# Patient Record
Sex: Female | Born: 1935 | Race: White | Hispanic: No | Marital: Married | Smoking: Never smoker
Health system: Southern US, Community
[De-identification: ages and names within clinical notes are randomized; demographics above are authoritative.]

## PROBLEM LIST (undated history)

## (undated) DIAGNOSIS — K5900 Constipation, unspecified: Secondary | ICD-10-CM

## (undated) DIAGNOSIS — K5790 Diverticulosis of intestine, part unspecified, without perforation or abscess without bleeding: Secondary | ICD-10-CM

## (undated) DIAGNOSIS — D649 Anemia, unspecified: Secondary | ICD-10-CM

## (undated) DIAGNOSIS — D472 Monoclonal gammopathy: Secondary | ICD-10-CM

## (undated) DIAGNOSIS — E039 Hypothyroidism, unspecified: Secondary | ICD-10-CM

## (undated) DIAGNOSIS — M1990 Unspecified osteoarthritis, unspecified site: Secondary | ICD-10-CM

## (undated) DIAGNOSIS — E785 Hyperlipidemia, unspecified: Secondary | ICD-10-CM

## (undated) DIAGNOSIS — I10 Essential (primary) hypertension: Secondary | ICD-10-CM

## (undated) DIAGNOSIS — D539 Nutritional anemia, unspecified: Secondary | ICD-10-CM

## (undated) DIAGNOSIS — E119 Type 2 diabetes mellitus without complications: Secondary | ICD-10-CM

## (undated) HISTORY — DX: Monoclonal gammopathy: D47.2

## (undated) HISTORY — DX: Nutritional anemia, unspecified: D53.9

## (undated) HISTORY — PX: TONSILLECTOMY: SUR1361

## (undated) HISTORY — PX: CATARACT EXTRACTION, BILATERAL: SHX1313

## (undated) HISTORY — PX: ABDOMINAL HYSTERECTOMY: SHX81

---

## 1998-03-25 ENCOUNTER — Ambulatory Visit (HOSPITAL_COMMUNITY): Admission: RE | Admit: 1998-03-25 | Discharge: 1998-03-25 | Payer: Self-pay | Admitting: Obstetrics & Gynecology

## 1998-05-13 ENCOUNTER — Ambulatory Visit (HOSPITAL_COMMUNITY): Admission: RE | Admit: 1998-05-13 | Discharge: 1998-05-13 | Payer: Self-pay | Admitting: Gastroenterology

## 1998-05-13 HISTORY — PX: ESOPHAGOGASTRODUODENOSCOPY ENDOSCOPY: SHX5814

## 2005-03-10 ENCOUNTER — Encounter: Admission: RE | Admit: 2005-03-10 | Discharge: 2005-03-10 | Payer: Self-pay | Admitting: Internal Medicine

## 2006-03-11 ENCOUNTER — Encounter: Admission: RE | Admit: 2006-03-11 | Discharge: 2006-03-11 | Payer: Self-pay | Admitting: Internal Medicine

## 2013-04-19 ENCOUNTER — Ambulatory Visit
Admission: RE | Admit: 2013-04-19 | Discharge: 2013-04-19 | Disposition: A | Discharge status: Home / Self Care | Payer: Medicare Other | Source: Ambulatory Visit | Attending: Internal Medicine | Admitting: Internal Medicine

## 2013-04-19 ENCOUNTER — Other Ambulatory Visit: Payer: Self-pay | Admitting: Internal Medicine

## 2013-04-19 DIAGNOSIS — R05 Cough: Secondary | ICD-10-CM

## 2013-04-19 DIAGNOSIS — R059 Cough, unspecified: Secondary | ICD-10-CM

## 2013-05-07 ENCOUNTER — Ambulatory Visit
Admission: RE | Admit: 2013-05-07 | Discharge: 2013-05-07 | Disposition: A | Discharge status: Home / Self Care | Payer: Medicare Other | Source: Ambulatory Visit | Attending: Internal Medicine | Admitting: Internal Medicine

## 2013-05-07 ENCOUNTER — Other Ambulatory Visit: Payer: Self-pay | Admitting: Internal Medicine

## 2013-05-07 DIAGNOSIS — W19XXXA Unspecified fall, initial encounter: Secondary | ICD-10-CM

## 2013-08-22 ENCOUNTER — Encounter (HOSPITAL_COMMUNITY): Payer: Self-pay | Admitting: Emergency Medicine

## 2013-08-22 ENCOUNTER — Inpatient Hospital Stay (HOSPITAL_COMMUNITY)
Admission: EM | Admit: 2013-08-22 | Discharge: 2013-08-24 | DRG: 545 | Disposition: A | Discharge status: Home Health | Payer: Medicare Other | Attending: Internal Medicine | Admitting: Internal Medicine

## 2013-08-22 ENCOUNTER — Observation Stay (HOSPITAL_COMMUNITY): Payer: Medicare Other

## 2013-08-22 DIAGNOSIS — R700 Elevated erythrocyte sedimentation rate: Secondary | ICD-10-CM | POA: Diagnosis present

## 2013-08-22 DIAGNOSIS — R5381 Other malaise: Secondary | ICD-10-CM | POA: Diagnosis present

## 2013-08-22 DIAGNOSIS — R29898 Other symptoms and signs involving the musculoskeletal system: Secondary | ICD-10-CM | POA: Diagnosis present

## 2013-08-22 DIAGNOSIS — E785 Hyperlipidemia, unspecified: Secondary | ICD-10-CM | POA: Diagnosis present

## 2013-08-22 DIAGNOSIS — E039 Hypothyroidism, unspecified: Secondary | ICD-10-CM | POA: Diagnosis present

## 2013-08-22 DIAGNOSIS — K5900 Constipation, unspecified: Secondary | ICD-10-CM | POA: Diagnosis present

## 2013-08-22 DIAGNOSIS — Z800 Family history of malignant neoplasm of digestive organs: Secondary | ICD-10-CM

## 2013-08-22 DIAGNOSIS — Z8249 Family history of ischemic heart disease and other diseases of the circulatory system: Secondary | ICD-10-CM

## 2013-08-22 DIAGNOSIS — D638 Anemia in other chronic diseases classified elsewhere: Secondary | ICD-10-CM | POA: Diagnosis present

## 2013-08-22 DIAGNOSIS — E860 Dehydration: Secondary | ICD-10-CM | POA: Diagnosis present

## 2013-08-22 DIAGNOSIS — I129 Hypertensive chronic kidney disease with stage 1 through stage 4 chronic kidney disease, or unspecified chronic kidney disease: Secondary | ICD-10-CM | POA: Diagnosis present

## 2013-08-22 DIAGNOSIS — R296 Repeated falls: Secondary | ICD-10-CM

## 2013-08-22 DIAGNOSIS — D649 Anemia, unspecified: Secondary | ICD-10-CM | POA: Diagnosis present

## 2013-08-22 DIAGNOSIS — M353 Polymyalgia rheumatica: Principal | ICD-10-CM | POA: Diagnosis present

## 2013-08-22 DIAGNOSIS — Z9849 Cataract extraction status, unspecified eye: Secondary | ICD-10-CM

## 2013-08-22 DIAGNOSIS — M129 Arthropathy, unspecified: Secondary | ICD-10-CM | POA: Diagnosis present

## 2013-08-22 DIAGNOSIS — I10 Essential (primary) hypertension: Secondary | ICD-10-CM

## 2013-08-22 DIAGNOSIS — Z9181 History of falling: Secondary | ICD-10-CM

## 2013-08-22 DIAGNOSIS — E119 Type 2 diabetes mellitus without complications: Secondary | ICD-10-CM | POA: Diagnosis present

## 2013-08-22 DIAGNOSIS — I498 Other specified cardiac arrhythmias: Secondary | ICD-10-CM | POA: Diagnosis present

## 2013-08-22 DIAGNOSIS — E871 Hypo-osmolality and hyponatremia: Secondary | ICD-10-CM | POA: Diagnosis present

## 2013-08-22 DIAGNOSIS — Z681 Body mass index (BMI) 19 or less, adult: Secondary | ICD-10-CM

## 2013-08-22 DIAGNOSIS — E440 Moderate protein-calorie malnutrition: Secondary | ICD-10-CM | POA: Diagnosis present

## 2013-08-22 DIAGNOSIS — K5790 Diverticulosis of intestine, part unspecified, without perforation or abscess without bleeding: Secondary | ICD-10-CM | POA: Insufficient documentation

## 2013-08-22 DIAGNOSIS — K5730 Diverticulosis of large intestine without perforation or abscess without bleeding: Secondary | ICD-10-CM | POA: Diagnosis present

## 2013-08-22 DIAGNOSIS — R000 Tachycardia, unspecified: Secondary | ICD-10-CM | POA: Diagnosis present

## 2013-08-22 DIAGNOSIS — D509 Iron deficiency anemia, unspecified: Secondary | ICD-10-CM | POA: Diagnosis present

## 2013-08-22 DIAGNOSIS — N183 Chronic kidney disease, stage 3 unspecified: Secondary | ICD-10-CM | POA: Diagnosis present

## 2013-08-22 DIAGNOSIS — E43 Unspecified severe protein-calorie malnutrition: Secondary | ICD-10-CM | POA: Diagnosis present

## 2013-08-22 HISTORY — DX: Type 2 diabetes mellitus without complications: E11.9

## 2013-08-22 HISTORY — DX: Unspecified osteoarthritis, unspecified site: M19.90

## 2013-08-22 HISTORY — DX: Diverticulosis of intestine, part unspecified, without perforation or abscess without bleeding: K57.90

## 2013-08-22 HISTORY — DX: Hyperlipidemia, unspecified: E78.5

## 2013-08-22 HISTORY — DX: Hypothyroidism, unspecified: E03.9

## 2013-08-22 HISTORY — DX: Essential (primary) hypertension: I10

## 2013-08-22 HISTORY — DX: Anemia, unspecified: D64.9

## 2013-08-22 HISTORY — DX: Constipation, unspecified: K59.00

## 2013-08-22 LAB — CBC WITH DIFFERENTIAL/PLATELET
Basophils Absolute: 0 10*3/uL (ref 0.0–0.1)
Basophils Relative: 0 % (ref 0–1)
Eosinophils Absolute: 0.1 10*3/uL (ref 0.0–0.7)
Eosinophils Relative: 1 % (ref 0–5)
HCT: 34.9 % — ABNORMAL LOW (ref 36.0–46.0)
Hemoglobin: 11.7 g/dL — ABNORMAL LOW (ref 12.0–15.0)
Lymphocytes Relative: 15 % (ref 12–46)
Lymphs Abs: 0.8 10*3/uL (ref 0.7–4.0)
MCH: 30.9 pg (ref 26.0–34.0)
MCHC: 33.5 g/dL (ref 30.0–36.0)
MCV: 92.1 fL (ref 78.0–100.0)
Monocytes Absolute: 0.8 10*3/uL (ref 0.1–1.0)
Monocytes Relative: 15 % — ABNORMAL HIGH (ref 3–12)
Neutro Abs: 3.6 10*3/uL (ref 1.7–7.7)
Neutrophils Relative %: 68 % (ref 43–77)
Platelets: 389 10*3/uL (ref 150–400)
RBC: 3.79 MIL/uL — ABNORMAL LOW (ref 3.87–5.11)
RDW: 13.2 % (ref 11.5–15.5)
WBC: 5.3 10*3/uL (ref 4.0–10.5)

## 2013-08-22 LAB — COMPREHENSIVE METABOLIC PANEL
ALT: 14 U/L (ref 0–35)
AST: 24 U/L (ref 0–37)
Albumin: 2.9 g/dL — ABNORMAL LOW (ref 3.5–5.2)
Alkaline Phosphatase: 60 U/L (ref 39–117)
BUN: 14 mg/dL (ref 6–23)
CO2: 28 mEq/L (ref 19–32)
Calcium: 9.3 mg/dL (ref 8.4–10.5)
Chloride: 94 mEq/L — ABNORMAL LOW (ref 96–112)
Creatinine, Ser: 0.85 mg/dL (ref 0.50–1.10)
GFR calc Af Amer: 67 mL/min — ABNORMAL LOW (ref >90)
GFR calc non Af Amer: 58 mL/min — ABNORMAL LOW (ref >90)
Glucose, Bld: 109 mg/dL — ABNORMAL HIGH (ref 70–99)
Potassium: 4.2 mEq/L (ref 3.5–5.1)
Sodium: 132 mEq/L — ABNORMAL LOW (ref 135–145)
Total Bilirubin: 0.4 mg/dL (ref 0.3–1.2)
Total Protein: 7.3 g/dL (ref 6.0–8.3)

## 2013-08-22 LAB — URINALYSIS, ROUTINE W REFLEX MICROSCOPIC
Bilirubin Urine: NEGATIVE
Glucose, UA: NEGATIVE mg/dL
Hgb urine dipstick: NEGATIVE
Ketones, ur: NEGATIVE mg/dL
Leukocytes, UA: NEGATIVE
Nitrite: NEGATIVE
Protein, ur: NEGATIVE mg/dL
Specific Gravity, Urine: 1.012 (ref 1.005–1.030)
Urobilinogen, UA: 0.2 mg/dL (ref 0.0–1.0)
pH: 6.5 (ref 5.0–8.0)

## 2013-08-22 LAB — OCCULT BLOOD X 1 CARD TO LAB, STOOL: Fecal Occult Bld: NEGATIVE

## 2013-08-22 MED ORDER — POLYETHYLENE GLYCOL 3350 17 G PO PACK
17.0000 g | PACK | Freq: Every day | ORAL | Status: DC | PRN
  Administered 2013-08-22 – 2013-08-23 (×2): 17 g via ORAL
  Filled 2013-08-22 (×2): qty 1

## 2013-08-22 MED ORDER — ONDANSETRON HCL 4 MG PO TABS: 4.0000 mg | ORAL_TABLET | Freq: Four times a day (QID) | ORAL | Status: DC | PRN

## 2013-08-22 MED ORDER — ACETAMINOPHEN 500 MG PO TABS: 500.0000 mg | ORAL_TABLET | Freq: Three times a day (TID) | ORAL | Status: DC | PRN

## 2013-08-22 MED ORDER — B COMPLEX-C PO TABS
1.0000 | ORAL_TABLET | Freq: Every day | ORAL | Status: DC
  Administered 2013-08-22 – 2013-08-24 (×3): 1 via ORAL
  Filled 2013-08-22 (×3): qty 1

## 2013-08-22 MED ORDER — ONDANSETRON HCL 4 MG/2ML IJ SOLN: 4.0000 mg | Freq: Four times a day (QID) | INTRAMUSCULAR | Status: DC | PRN

## 2013-08-22 MED ORDER — ENSURE COMPLETE PO LIQD
237.0000 mL | Freq: Two times a day (BID) | ORAL | Status: DC
  Administered 2013-08-22 – 2013-08-24 (×4): 237 mL via ORAL

## 2013-08-22 MED ORDER — SODIUM CHLORIDE 0.9 % IV SOLN
INTRAVENOUS | Status: DC
  Administered 2013-08-22: 16:00:00 via INTRAVENOUS
  Administered 2013-08-22 – 2013-08-23 (×2): 1000 mL via INTRAVENOUS

## 2013-08-22 MED ORDER — METOPROLOL TARTRATE 25 MG PO TABS
25.0000 mg | ORAL_TABLET | Freq: Every day | ORAL | Status: DC
  Administered 2013-08-22 – 2013-08-24 (×3): 25 mg via ORAL
  Filled 2013-08-22 (×4): qty 1

## 2013-08-22 MED ORDER — SODIUM CHLORIDE 0.9 % IV BOLUS (SEPSIS)
1000.0000 mL | Freq: Once | INTRAVENOUS | Status: AC
  Administered 2013-08-22: 1000 mL via INTRAVENOUS

## 2013-08-22 MED ORDER — POLYVINYL ALCOHOL 1.4 % OP SOLN
1.0000 [drp] | Freq: Every day | OPHTHALMIC | Status: DC
  Administered 2013-08-22 – 2013-08-23 (×2): 1 [drp] via OPHTHALMIC
  Filled 2013-08-22: qty 15

## 2013-08-22 MED ORDER — ENOXAPARIN SODIUM 40 MG/0.4ML ~~LOC~~ SOLN: 40.0000 mg | SUBCUTANEOUS | Status: DC

## 2013-08-22 MED ORDER — SIMVASTATIN 20 MG PO TABS
20.0000 mg | ORAL_TABLET | Freq: Every day | ORAL | Status: DC
  Administered 2013-08-22 – 2013-08-23 (×2): 20 mg via ORAL
  Filled 2013-08-22 (×3): qty 1

## 2013-08-22 MED ORDER — ENOXAPARIN SODIUM 30 MG/0.3ML ~~LOC~~ SOLN
30.0000 mg | SUBCUTANEOUS | Status: DC
  Administered 2013-08-22 – 2013-08-23 (×2): 30 mg via SUBCUTANEOUS
  Filled 2013-08-22 (×3): qty 0.3

## 2013-08-22 MED ORDER — LEVOTHYROXINE SODIUM 125 MCG PO TABS
125.0000 ug | ORAL_TABLET | Freq: Every day | ORAL | Status: DC
  Administered 2013-08-23 – 2013-08-24 (×2): 125 ug via ORAL
  Filled 2013-08-22 (×3): qty 1

## 2013-08-22 MED ORDER — ADULT MULTIVITAMIN W/MINERALS CH
1.0000 | ORAL_TABLET | Freq: Every day | ORAL | Status: DC
  Administered 2013-08-22 – 2013-08-24 (×3): 1 via ORAL
  Filled 2013-08-22 (×4): qty 1

## 2013-08-22 NOTE — Progress Notes (Signed)
Choice offered  HOME HEALTH AGENCIES SERVING [place]   Agencies that are Medicare-Certified and are affiliated with [facility]  Telephone Number Address  [facility]   [facility] has ownership interest in this company; however, you are under no obligation to use this agency. [phone] or  [phone] [address] [place], [place] [address] [email or web address]   Agencies that are Medicare-Certified and are not affiliated with [facility]                                                                                 Home Health Agency Telephone Number Address  [facility] [phone] Fax [phone] [address], [address] [place], [place]  [address] [email or web address]  [facility] [phone] or [phone] Fax [phone] [address] [place], [place] [address] [email or web address]  [facility] [phone] Fax [phone] [address] [place], [place] [address] [email or web address]  [facility] [phone] Fax [phone] [address], [address] [place], [place]  [address] [email or web address]  [facility] [phone] Fax [phone] [address], [address] [place], [place] [address] [email or web address]  [facility] [phone] [address] [place], [place] [address] [email or web address]  [facility] [phone]  [address] [place], [place] [address] [email or web address]  [facility] [phone] or [phone] Fax number [phone] [address] [place], [place]  [address] [email or web address]  [facility] [phone] Fax [phone] [address] [place], [place]  [address]   [facility]  [phone] Fax [phone] [address] [place], [place] [address] [email or web address]   

## 2013-08-22 NOTE — Progress Notes (Signed)
   CARE MANAGEMENT ED NOTE 08/22/2013  Patient:  ZABRINA, BROTHERTON   Account Number:  [id number]  Date Initiated:  08/22/2013  Documentation initiated by:  Edd Arbour  Subjective/Objective Assessment:   79 yr old medicare/ AARP without a pcp listed in EPIC Pt confirmed pcp as Burton Apley No admission but 1 ED visit in last 6 months Pt states her anticipated d/c is home when she is feeling better with support of her family see below note     Subjective/Objective Assessment Detail:   c/o recommendation from pcp to come to ED to be admitted for dehydration, weakness, diarrhea, anemia according to blood work done earlier this week in his office. Pt also has left rib pain that radiates to back that PCP hasn't been able to treat since August.  Pt has been having multiple falls  support system listed as husband, MONZERRATH MCBURNEY and 2 daughters, & grand children HR 110-129 SBP 154 decreased to 116 hgb 11.7 wbc 5.3     Action/Plan:   Cm spoke with pt updated EPIC 1145 Cm assessed pt & daughter at bedside   Action/Plan Detail:   UR completed 08/22/13 1428 sent to medmgt pending return call   Anticipated DC Date:  08/23/2013     Status Recommendation to Physician:   Result of Recommendation:    Other ED Services  Consult Working Plan    DC Planning Services  Other  PCP issues  Outpatient Services - Pt will follow up    Choice offered to / List presented to:            Status of service:  Completed, signed off  ED Comments:   ED Comments Detail:  Mrs Polidore states her anticipated d/c is home when she is feeling better with support of her family and possible need of home health PT for weakness and DME to include Shower stool and shower grip bar  CM reviewed in details medicare guidelines, home health (HH) (length of stay in home, types of [facility] staff available, coverage, primary caregiver, up to 24 hrs before services may be started), Private duty nursing (PDN-coverage, length of stay  in the home types of staff available), CM reviewed availability of HH SW to assist pcp to get pt to snf (if desired disposition) from the community level. CM provided family with a list of [place] home health agencies & PDNs  Discussed pt to be further evaluated by unit therapists (PT/OT) for recommendation of level of care if admitted and recommendations will be shared with attending MD and unit CM

## 2013-08-22 NOTE — ED Notes (Signed)
Pt PCP  Recommends that pt come in and be admitted for dehydration, weakness, anemia according to blood work done earlier this week in his office. Pt also has left rib pain that radiates to back that PCP hasnt been able to treat since August.  Pt has been having multiple falls.

## 2013-08-22 NOTE — Progress Notes (Signed)
   CARE MANAGEMENT ED NOTE 08/22/2013  Patient:  Marcia Cooper, Marcia Cooper   Account Number:  [id number]  Date Initiated:  08/22/2013  Documentation initiated by:  Edd Arbour  Subjective/Objective Assessment:   79 yr old medicare/ AARP without a pcp listed in EPIC Pt confirmed pcp as Burton Apley No admission but 1 ED visit in last 6 months Pt states her anticipated d/c is home when she is feeling better with support of her family see below note     Subjective/Objective Assessment Detail:   c/o recommendation from pcp to come to ED to be admitted for dehydration, weakness, diarrhea, anemia according to blood work done earlier this week in his office. Pt also has left rib pain that radiates to back that PCP hasn't been able to treat since August.  Pt has been having multiple falls  support system listed as husband, MAHIMA HOTTLE and 2 daughters, & grand children HR 110-129 SBP 154 decreased to 116 hgb 11.7 wbc 5.3     Action/Plan:   Cm spoke with pt updated EPIC 1145 Cm assessed pt & daughter at bedside   Action/Plan Detail:   Anticipated DC Date:       Status Recommendation to Physician:   Result of Recommendation:    Other ED Services  Consult Working Plan    DC Planning Services  Other  PCP issues  Outpatient Services - Pt will follow up    Choice offered to / List presented to:            Status of service:  Completed, signed off  ED Comments:   ED Comments Detail:  Mrs Sand states her anticipated d/c is home when she is feeling better with support of her family and possible need of home health PT for weakness and DME to include Shower stool and shower grip bar  CM reviewed in details medicare guidelines, home health (HH) (length of stay in home, types of [facility] staff available, coverage, primary caregiver, up to 24 hrs before services may be started), Private duty nursing (PDN-coverage, length of stay in the home types of staff available), CM reviewed availability of HH SW to  assist pcp to get pt to snf (if desired disposition) from the community level. CM provided family with a list of [place] home health agencies & PDNs  Discussed pt to be further evaluated by unit therapists (PT/OT) for recommendation of level of care if admitted and recommendations will be shared with attending MD and unit CM

## 2013-08-22 NOTE — H&P (Signed)
[facility] History and Physical  [name] HYQ:[id number] DOB: November 30, 1919 DOA: 08/22/2013  Referring physician:  Raeford Razor PCP:  Lorenda Peck, MD   Chief Complaint:  Fatigue, falls  HPI:  The patient is a 79 y.o. year-old female with history of hypertension, hyperlipidemia, and type 2 diabetes diet controlled, diverticulosis with history of iron deficiency anemia, hypothyroidism who presents with progressive lower extremity weakness, fatigue, frequent falls.  The patient was last at their baseline health several months ago.  The patient states that since August when she had an episode of bronchitis, that her strength has not been the same. She is able to get around her house without cane or [place], however, over the last month, she has had progressive lower extremity weakness and fatigue. About 2 months ago, she had an episode of left upper thoracic back pain for which she was prescribed a Medrol Dosepak which she completed. She continued to have pain, so her primary care doctor gave her a course of prednisone to 10 mg twice daily for 2 weeks. She completed this course of steroids in early November. After she stopped the steroid medication, her weakness is gotten progressively worse. She has some mild swelling of the ankles, but no significant edema, shortness of breath at rest, or orthopnea. She denies any unusual bruising or bleeding. She denies numbness or tingling of her legs, or pain radiating down her legs. She states that her arms have normal strength. Last week, she visited her daughter, and she slept for most of the visit when normally she is very chatty and active. She has a very mild basal cough, denies dysuria, has baseline urinary frequency.  Last colonoscopy was discontinued due to severity of diverticulosis.  Review of Systems:  General:  Denies fevers, chills, weight loss or gain HEENT:  Denies changes to hearing and vision, rhinorrhea, sinus congestion, sore  throat CV:  Denies chest pain and palpitations, lower extremity edema.  PULM:  Denies SOB, wheezing.  Mild cough. GI:  Denies nausea, vomiting.  Chronic constipation.   GU:  Denies dysuria. Chronic frequency, urgency ENDO:  Denies polyuria, polydipsia.   HEME:  Denies hematemesis, blood in stools, melena, abnormal bruising or bleeding.  LYMPH:  Denies lymphadenopathy.   MSK:  Chronic intermittent left mid-thoracic pain.   DERM:  Denies skin rash or ulcer.   NEURO:  Denies focal numbness, weakness, slurred speech, confusion, facial droop except as noted on HPI PSYCH:  Denies anxiety and depression.    Past Medical History  Diagnosis Date  . Hypothyroidism   . Hypertension   . Hyperlipidemia   . Diabetes mellitus without complication   . Anemia   . Arthritis   . Diverticulosis   . Constipation    Past Surgical History  Procedure Laterality Date  . Tonsillectomy    . Abdominal hysterectomy    . Cataract extraction, bilateral    . Esophagogastroduodenoscopy endoscopy  05/13/1998   Social History:  reports that she has never smoked. She has never used smokeless tobacco. She reports that she does not drink alcohol or use illicit drugs. Lives with her husband and ambulates without assist device  No Known Allergies  Family History  Problem Relation Age of Onset  . Colon cancer Mother   . Heart disease Father   . Thyroid disease Daughter   . Thyroid disease Daughter      Prior to Admission medications   Medication Sig Start Date End Date Taking? Authorizing Provider  acetaminophen (TYLENOL) 500  MG tablet Take 500 mg by mouth every 8 (eight) hours as needed for mild pain.   Yes Historical Provider, MD  b complex vitamins tablet Take 1 tablet by mouth daily.   Yes Historical Provider, MD  Calcium-Magnesium-Vitamin D (CALCIUM 500 PO) Take 500 mg by mouth daily.   Yes Historical Provider, MD  carboxymethylcellulose (REFRESH TEARS) 0.5 % SOLN Place 1 drop into both eyes at bedtime.    Yes Historical Provider, MD  levothyroxine (SYNTHROID, LEVOTHROID) 125 MCG tablet Take 125 mcg by mouth daily before breakfast.   Yes Historical Provider, MD  metoprolol tartrate (LOPRESSOR) 25 MG tablet Take 25 mg by mouth daily.   Yes Historical Provider, MD  Multiple Vitamins-Calcium (ONE-A-DAY WOMENS PO) Take 1 tablet by mouth daily.   Yes Historical Provider, MD  polyethylene glycol (MIRALAX / GLYCOLAX) packet Take 17 g by mouth daily as needed for mild constipation.   Yes Historical Provider, MD  simvastatin (ZOCOR) 20 MG tablet Take 20 mg by mouth daily.   Yes Historical Provider, MD   Physical Exam: Filed Vitals:   08/22/13 1000 08/22/13 1045 08/22/13 1252 08/22/13 1310  BP: 115/68  124/71 141/63  Pulse: 91 94 97 105  Temp:   97.8 F (36.6 C) 98.2 F (36.8 C)  TempSrc:   Oral Oral  Resp: 21 23 13 16   Height:      Weight:      SpO2: 97% 95% 96% 97%     General:  Cachectic CF, NAD  Eyes:  PERRL, anicteric, non-injected.  ENT:  Nares clear.  OP clear, non-erythematous without plaques or exudates.  MMM.  Neck:  Supple without TM or JVD.    Lymph:  No cervical, supraclavicular, or submandibular LAD.  Cardiovascular:  RRR, normal S1, S2, without m/r/g.  2+ pulses, warm extremities  Respiratory:  CTA bilaterally without increased WOB.  Abdomen:  NABS.  Soft, ND/NT.    Skin:  No rashes or focal lesions.  Musculoskeletal:  Normal bulk and tone.  Trace bilateral LE edema.  Psychiatric:  A & O x 4.  Appropriate affect.  Neurologic:  CN 3-12 intact.  5/5 strength bilateral lower extremities.  Sensation intact.  Labs on Admission:  Basic Metabolic Panel:  Recent Labs Lab 08/22/13 0840  NA 132*  K 4.2  CL 94*  CO2 28  GLUCOSE 109*  BUN 14  CREATININE 0.85  CALCIUM 9.3   Liver Function Tests:  Recent Labs Lab 08/22/13 0840  AST 24  ALT 14  ALKPHOS 60  BILITOT 0.4  PROT 7.3  ALBUMIN 2.9*   No results found for this basename: LIPASE, AMYLASE,  in the  last 168 hours No results found for this basename: AMMONIA,  in the last 168 hours CBC:  Recent Labs Lab 08/22/13 0840  WBC 5.3  NEUTROABS 3.6  HGB 11.7*  HCT 34.9*  MCV 92.1  PLT 389   Cardiac Enzymes: No results found for this basename: CKTOTAL, CKMB, CKMBINDEX, TROPONINI,  in the last 168 hours  BNP (last 3 results) No results found for this basename: PROBNP,  in the last 8760 hours CBG: No results found for this basename: GLUCAP,  in the last 168 hours  Radiological Exams on Admission: No results found.  EKG: Independently reviewed. Sinus tachycardia with LVH  Assessment/Plan Principal Problem:   Lower extremity weakness Active Problems:   Dehydration   Sinus tachycardia   Normocytic anemia   Hyponatremia   Hypothyroidism   Hypertension   Hyperlipidemia  Diabetes mellitus without complication   Constipation   Recurrent falls  ---  Recurrent falls attributed to lower extremity weakness and fatigue, particularly noticeable over the last month.  DDx is broad, but may be related to recent steroid doses, adrenal insufficiency, thyroid abnl, underlying malignancy, progressive anemia, inflammatory disorder such as polymyalgia rheumatica.     -  PT/OT -  TSH -  Cortisol level -  ESR -  CK  Sinus tachycardia, may be related to dehydration and improved some in the ER with hydration -  Continue IVF -  Check D-dimer to rule out PE (particularly in setting of unexplained left back pain).  If D-dimer > 910, will order V/Q scan as CrCl is only 30 -  TSH pending -  Alternatively, may have missed a dose of metoprolol and having rebound tachycardia  Normocytic anemia with elevated globulin gap -  TSH, folate, B12, iron studies -  SPEP, UPEP/IFE  Hyponatremia and hypochloremia are likely secondary to dehydration -  Repeat in AM  T2DM, diet controlled.  No need for CBG unless A1c elevated. -  Check A1c  HTN/HLD, blood pressure mildly elevated -  Continue beta  blocker at  Home dose today and increase if BP remains elevated -  Continue statin  Constipation, stable.  Continue miralax prn  Moderate protein-calorie malnutrition -  Regular diet with ensure supplements  Diet:  regular Access:  PIV IVF:  yes Proph:  lovenox  Code Status: full Family Communication: patient and her daughter Disposition Plan: Admit to med-surg  Time spent: 60 min Renae Fickle [facility] Pager [phone]  If 7PM-7AM, please contact night-coverage [email or web address] Password [email or web address] 08/22/2013, 2:12 PM

## 2013-08-22 NOTE — ED Provider Notes (Signed)
CSN: [id number]     Arrival date & time 08/22/13  4098 History   First MD Initiated Contact with Patient 08/22/13 0813     Chief Complaint  Patient presents with  . Dehydration  . Weakness   (Consider location/radiation/quality/duration/timing/severity/associated sxs/prior Treatment) HPI  79 year old female with generalized weakness and fatigue. Gradual onset approximately 2 weeks ago and progressively worsening. Patient lives independently with her husband. Her daughter noticed that on Thanksgiving she was falling asleep throughout the day which is unusual for her. Very weak and has needed assistance getting around and even getting up from the toilet on one occasion. No fevers or chills. No shortness of breath. No vomiting or diarrhea. No recent medication changes. Denies any recent significant trauma.  Patient's only pain complaint is left mid back pain,. This has been ongoing for several months and has been evaluated by her PCP without clear etiology. This has been acutely changed. No BRBPR or melena.   History reviewed. No pertinent past medical history. No past surgical history on file. No family history on file. History  Substance Use Topics  . Smoking status: Not on file  . Smokeless tobacco: Not on file  . Alcohol Use: Not on file   OB History   Grav Para Term Preterm Abortions TAB SAB Ect Mult Living                 Review of Systems  All systems reviewed and negative, other than as noted in HPI.   Allergies  Review of patient's allergies indicates no known allergies.  Home Medications   Current Outpatient Rx  Name  Route  Sig  Dispense  Refill  . acetaminophen (TYLENOL) 500 MG tablet   Oral   Take 500 mg by mouth every 8 (eight) hours as needed for mild pain.         Marland Kitchen b complex vitamins tablet   Oral   Take 1 tablet by mouth daily.         . Calcium-Magnesium-Vitamin D (CALCIUM 500 PO)   Oral   Take 500 mg by mouth daily.         .  carboxymethylcellulose (REFRESH TEARS) 0.5 % SOLN   Both Eyes   Place 1 drop into both eyes at bedtime.         Marland Kitchen levothyroxine (SYNTHROID, LEVOTHROID) 125 MCG tablet   Oral   Take 125 mcg by mouth daily before breakfast.         . metoprolol tartrate (LOPRESSOR) 25 MG tablet   Oral   Take 25 mg by mouth daily.         . Multiple Vitamins-Calcium (ONE-A-DAY WOMENS PO)   Oral   Take 1 tablet by mouth daily.         . polyethylene glycol (MIRALAX / GLYCOLAX) packet   Oral   Take 17 g by mouth daily as needed for mild constipation.         . simvastatin (ZOCOR) 20 MG tablet   Oral   Take 20 mg by mouth daily.          BP 116/81  Pulse 129  Temp(Src) 98.2 F (36.8 C) (Oral)  Resp 22  Ht 5\' 6"  (1.676 m)  Wt 98 lb (44.453 kg)  BMI 15.83 kg/m2  SpO2 97% Physical Exam  Nursing note and vitals reviewed. Constitutional: No distress.  frail and elderly appearing, but not toxic.   HENT:  Head: Normocephalic and atraumatic.  Eyes: Conjunctivae are  normal. Right eye exhibits no discharge. Left eye exhibits no discharge.  Neck: Neck supple.  Cardiovascular: Regular rhythm and normal heart sounds.  Exam reveals no gallop and no friction rub.   No murmur heard. tachycardic  Pulmonary/Chest: Effort normal and breath sounds normal. No respiratory distress.  Abdominal: Soft. She exhibits no distension. There is no tenderness.  Musculoskeletal: She exhibits no edema and no tenderness.  Neurological: She is alert. No cranial nerve deficit. She exhibits normal muscle tone. Coordination normal.  Pleasant and conversive  Skin: Skin is warm and dry. She is not diaphoretic.  Psychiatric: She has a normal mood and affect. Her behavior is normal. Thought content normal.    ED Course  Procedures (including critical care time) Labs Review Labs Reviewed  CBC WITH DIFFERENTIAL - Abnormal; Notable for the following:    RBC 3.79 (*)    Hemoglobin 11.7 (*)    HCT 34.9 (*)     Monocytes Relative 15 (*)    All other components within normal limits  COMPREHENSIVE METABOLIC PANEL - Abnormal; Notable for the following:    Sodium 132 (*)    Chloride 94 (*)    Glucose, Bld 109 (*)    Albumin 2.9 (*)    GFR calc non Af Amer 58 (*)    GFR calc Af Amer 67 (*)    All other components within normal limits  URINALYSIS, ROUTINE W REFLEX MICROSCOPIC  OCCULT BLOOD X 1 CARD TO LAB, STOOL   Imaging Review No results found.  EKG Interpretation    Date/Time:  Wednesday August 22 2013 08:22:54 EST Ventricular Rate:  118 PR Interval:  141 QRS Duration: 85 QT Interval:  338 QTC Calculation: 474 R Axis:   69 Text Interpretation:  Sinus tachycardia Probable LVH with secondary repol abnrm No old tracing to compare Confirmed by Kendall Arnell  MD, Moe Graca ([id number]) on 08/22/2013 11:23:51 AM            MDM   1. Dehydration   2. Physical deconditioning      89yF with generalized fatigue. Suspect some level of dehydration. Sinus tachycardia up to 130s on arrival. Improving with IVF. Mild hyponatremia would support this as well. Renal function ok. Afebrile. BP fine. UA clean. Minimally anemic. Shouldn't account for extent of pt's symptoms. Pt lives independently with 52 year old husband. Currently so tired/fatigued that having difficulty taking care of ADLs. Mentally very sharp, but deconditioned. Will discuss with medicine for possible admit for observation and continuing hydration. Ultimately I think pt is in need of some level of rehab/PT or at least additional assistance at home.   Raeford Razor, MD 08/22/13 1128

## 2013-08-22 NOTE — Progress Notes (Deleted)
  Echocardiogram 2D Echocardiogram has been performed.  Cathie Beams 08/22/2013, 4:09 PM

## 2013-08-22 NOTE — Progress Notes (Signed)
Called for report. ED nurse busy. Floor Rn's number given for ED RN to call when able.

## 2013-08-23 ENCOUNTER — Observation Stay (HOSPITAL_COMMUNITY): Payer: Medicare Other

## 2013-08-23 DIAGNOSIS — R29898 Other symptoms and signs involving the musculoskeletal system: Secondary | ICD-10-CM

## 2013-08-23 DIAGNOSIS — E440 Moderate protein-calorie malnutrition: Secondary | ICD-10-CM

## 2013-08-23 DIAGNOSIS — I059 Rheumatic mitral valve disease, unspecified: Secondary | ICD-10-CM

## 2013-08-23 LAB — BASIC METABOLIC PANEL
BUN: 12 mg/dL (ref 6–23)
Chloride: 101 mEq/L (ref 96–112)
Creatinine, Ser: 0.75 mg/dL (ref 0.50–1.10)
GFR calc Af Amer: 83 mL/min — ABNORMAL LOW (ref >90)
GFR calc non Af Amer: 72 mL/min — ABNORMAL LOW (ref >90)
Glucose, Bld: 93 mg/dL (ref 70–99)

## 2013-08-23 LAB — CBC
HCT: 29.9 % — ABNORMAL LOW (ref 36.0–46.0)
MCH: 30.7 pg (ref 26.0–34.0)
Platelets: 337 10*3/uL (ref 150–400)
RDW: 13.3 % (ref 11.5–15.5)
WBC: 5.1 10*3/uL (ref 4.0–10.5)

## 2013-08-23 LAB — IRON AND TIBC
Iron: 30 ug/dL — ABNORMAL LOW (ref 42–135)
Saturation Ratios: 16 % — ABNORMAL LOW (ref 20–55)
TIBC: 188 ug/dL — ABNORMAL LOW (ref 250–470)

## 2013-08-23 LAB — HEMOGLOBIN A1C: Hgb A1c MFr Bld: 6.6 % — ABNORMAL HIGH (ref <5.7)

## 2013-08-23 LAB — FERRITIN: Ferritin: 166 ng/mL (ref 10–291)

## 2013-08-23 LAB — SEDIMENTATION RATE: Sed Rate: 94 mm/hr — ABNORMAL HIGH (ref 0–22)

## 2013-08-23 LAB — C-REACTIVE PROTEIN: CRP: 1.9 mg/dL — ABNORMAL HIGH (ref <0.60)

## 2013-08-23 LAB — FOLATE RBC: RBC Folate: 1054 ng/mL — ABNORMAL HIGH (ref >280)

## 2013-08-23 LAB — TSH: TSH: 0.71 u[IU]/mL (ref 0.350–4.500)

## 2013-08-23 LAB — TRANSFERRIN: Transferrin: 141 mg/dL — ABNORMAL LOW (ref 200–360)

## 2013-08-23 MED ORDER — TECHNETIUM TC 99M DIETHYLENETRIAME-PENTAACETIC ACID: 44.0000 | Freq: Once | INTRAVENOUS | Status: AC | PRN

## 2013-08-23 MED ORDER — PREDNISONE 10 MG PO TABS
10.0000 mg | ORAL_TABLET | Freq: Two times a day (BID) | ORAL | Status: DC
  Administered 2013-08-23 – 2013-08-24 (×2): 10 mg via ORAL
  Filled 2013-08-23 (×4): qty 1

## 2013-08-23 MED ORDER — TECHNETIUM TO 99M ALBUMIN AGGREGATED
5.1000 | Freq: Once | INTRAVENOUS | Status: AC | PRN
  Administered 2013-08-23: 5.1 via INTRAVENOUS

## 2013-08-23 NOTE — Progress Notes (Signed)
UR completed.  Patient changed to inpatient r/t con't to require IVF 

## 2013-08-23 NOTE — Evaluation (Signed)
Physical Therapy Evaluation Patient Details Name: Pebbles Zeiders MRN: [id number] DOB: 1920-05-21 Today's Date: 08/23/2013 Time: 1308-6578 PT Time Calculation (min): 10 min  PT Assessment / Plan / Recommendation History of Present Illness  Morningstar Toft is an 79 y.o. female with PMH of hypertension, hyperlipidemia, and type 2 diabetes diet controlled, diverticulosis with history of iron deficiency anemia, and hypothyroidism who was admitted on 08/22/2013 with progressive lower extremity weakness, fatigue, and frequent falls. Initial evaluation emergency department was largely unremarkable except for some mild dehydration.  Clinical Impression  Pt admitted with above. Pt currently with functional limitations due to the deficits listed below (see PT Problem List).  Pt will benefit from skilled PT to increase their independence and safety with mobility to allow discharge to the venue listed below.  Pt reports 2 recent falls at home however states she was weak due to bronchitis.  Discussed using SPC today if unsteadiness does not improve however pt did not seem very receptive to idea.  Will follow acutely.     PT Assessment  Patient needs continued PT services    Follow Up Recommendations  Home health PT    Does the patient have the potential to tolerate intense rehabilitation      Barriers to Discharge        Equipment Recommendations  Other (comment) (possibly cane however pt may not accept)    Recommendations for Other Services     Frequency Min 3X/week    Precautions / Restrictions Precautions Precautions: Fall   Pertinent Vitals/Pain n/a      Mobility  Bed Mobility Bed Mobility: Supine to Sit Supine to Sit: 6: Modified independent (Device/Increase time) Transfers Transfers: Stand to Sit;Sit to Stand Sit to Stand: 5: Supervision;From bed;With upper extremity assist Stand to Sit: 5: Supervision;To chair/3-in-1;With upper extremity assist Ambulation/Gait Ambulation/Gait  Assistance: 4: Min guard Ambulation Distance (Feet): 180 Feet Assistive device: None Ambulation/Gait Assistance Details: pt with a couple instances of unsteadiness looking around environment however able to self correct, discussed SPC next visit however pt feels she will do better next time as today is her first walk without IV pole Gait Pattern: Step-through pattern;Decreased trunk rotation;Narrow base of support;Decreased stride length Gait velocity: decr    Exercises     PT Diagnosis: Difficulty walking  PT Problem List: Decreased strength;Decreased balance;Decreased mobility;Decreased knowledge of use of DME PT Treatment Interventions: DME instruction;Gait training;Functional mobility training;Therapeutic activities;Therapeutic exercise;Neuromuscular re-education;Balance training;Patient/family education     PT Goals(Current goals can be found in the care plan section) Acute Rehab PT Goals PT Goal Formulation: With patient Time For Goal Achievement: 09/06/13 Potential to Achieve Goals: Good  Visit Information  Last PT Received On: 08/23/13 Assistance Needed: +1 History of Present Illness: Adrijana Haros is an 79 y.o. female with PMH of hypertension, hyperlipidemia, and type 2 diabetes diet controlled, diverticulosis with history of iron deficiency anemia, and hypothyroidism who was admitted on 08/22/2013 with progressive lower extremity weakness, fatigue, and frequent falls. Initial evaluation emergency department was largely unremarkable except for some mild dehydration.       Prior Functioning  Home Living Family/patient expects to be discharged to:: Private residence Living Arrangements: Spouse/significant other Type of Home: House Home Access: Stairs to enter Secretary/administrator of Steps: 1 Home Layout: One level Home Equipment: None Prior Function Level of Independence: Independent Communication Communication: No difficulties    Cognition   Cognition Arousal/Alertness: Awake/alert Behavior During Therapy: WFL for tasks assessed/performed Overall Cognitive Status: Within Functional Limits for tasks assessed  Extremity/Trunk Assessment Lower Extremity Assessment Lower Extremity Assessment: Generalized weakness   Balance Balance Balance Assessed: Yes High Level Balance High Level Balance Activites: Head turns High Level Balance Comments: demonstrates unsteadiness with head turns however no physical assist to correct  End of Session PT - End of Session Activity Tolerance: Patient tolerated treatment well Patient left: in chair;with call bell/phone within reach;with family/visitor present  GP     Maxyne Derocher,KATHrine E 08/23/2013, 3:54 PM Zenovia Jarred, PT, DPT 08/23/2013 Pager: [phone]

## 2013-08-23 NOTE — Progress Notes (Signed)
  Echocardiogram 2D Echocardiogram has been performed.  Cathie Beams 08/23/2013, 2:34 PM

## 2013-08-23 NOTE — Progress Notes (Signed)
[facility] PROGRESS NOTE   Marcia Cooper ZOX:[id number] DOB: 1919-11-12 DOA: 08/22/2013 PCP: Lorenda Peck, MD  Brief narrative: Marcia Cooper is an 79 y.o. female with PMH of hypertension, hyperlipidemia, and type 2 diabetes diet controlled, diverticulosis with history of iron deficiency anemia, and hypothyroidism who was admitted on 08/22/2013 with progressive lower extremity weakness, fatigue, and frequent falls. Initial evaluation emergency department was largely unremarkable except for some mild dehydration.   Assessment/Plan: Principal Problem:   Lower extremity weakness with recurrent falls The patient was admitted and a workup was initiated. TSH was normal at 0.710. CK was normal at 58. ESR was elevated at 94. Cortisol levels are pending. Patient has some chronic left subscapular pain, and it appears that she has been treated with a two-week course of prednisone 10 mg twice a day, I assume for suspected polymyalgia rheumatica. The patient does state that her symptoms improved on prednisone therapy. Check CRP.  If PMR, symptoms are generally 50 to 70 percent better within three days of being started on prednisone (10 to 20 mg/day) and almost all patients respond completely within two weeks of initiation of therapy. She likely needs a more prolonged course and therefore we'll resume prednisone 10 mg twice a day. Two-dimensional echocardiogram also ordered. PT/OT evaluations requested. Active Problems:   Dehydration Hydrated overnight. Normal saline lock IV.   Sinus tachycardia Thought to be from dehydration. D-dimer elevated at 1.44. No hypoxia noted. VQ scan ordered to rule out underlying pulmonary embolism. No evidence of hyperthyroidism induced by over replacement of Synthroid. Continue metoprolol.   Normocytic anemia Almost 2 g drop in hemoglobin noted, likely dilutional. Suspect anemia of chronic disease given her elevated sedimentation rate. Given elevated globulin  gap, multiple myeloma needs to be ruled out. Followup SPEP, UPEP/IFE. Followup anemia studies including RBC folate and B12. Transferrin level, iron, TIBC and saturation ratios all low. Ferritin pending.   Hyponatremia Mild. Marginally improved with IV fluids.   Hypothyroidism Continue current dose of Synthroid, appropriately replaced.   Hypertension Continue metoprolol.   Hyperlipidemia Continue statin.   Diabetes mellitus without complication Diet controlled at home. Hemoglobin A1c 6.6% corresponding to a mean plasma glucose of 143.   Constipation Continue MiraLAX as needed.   CKD (chronic kidney disease) stage 3, GFR 30-59 ml/min Creatinine stable.   Moderate protein-calorie malnutrition Continue Ensure supplements.  Code Status: Full. Family Communication: No family at the bedside. Disposition Plan: Home when stable.   IV access:  Peripheral IV.  Medical Consultants:  None.  Other Consultants:  Physical therapy  Occupational therapy  Anti-infectives:  None.  HPI/Subjective: Marcia Cooper tells me that she has had left subscapular pain, treated with a two-week course of prednisone 10 mg twice a day which improved the pain, but that it is now back again. She has had the lower extremity weakness progressive over the past few weeks. Appetite is good. She continues to feel fatigued.  Objective: Filed Vitals:   08/22/13 1252 08/22/13 1310 08/22/13 2220 08/23/13 0518  BP: 124/71 141/63 131/65 128/71  Pulse: 97 105 89 99  Temp: 97.8 F (36.6 C) 98.2 F (36.8 C) 98.5 F (36.9 C) 98.4 F (36.9 C)  TempSrc: Oral Oral Oral Oral  Resp: 13 16 16 16   Height:      Weight:      SpO2: 96% 97% 95% 96%    Intake/Output Summary (Last 24 hours) at 08/23/13 0721 Last data filed at 08/22/13 1441  Gross per 24 hour  Intake  240 ml  Output      0 ml  Net    240 ml    Exam: Gen:  NAD Cardiovascular:  Tachycardic, regular, No M/R/G Respiratory:  Lungs  CTAB Gastrointestinal:  Abdomen soft, NT/ND, + BS Extremities:  Trace edema bilaterally  Data Reviewed: asic Metabolic Panel:  Recent Labs Lab 08/22/13 0840 08/23/13 0359  NA 132* 133*  K 4.2 3.9  CL 94* 101  CO2 28 25  GLUCOSE 109* 93  BUN 14 12  CREATININE 0.85 0.75  CALCIUM 9.3 8.2*   GFR Estimated Creatinine Clearance: 32.2 ml/min (by C-G formula based on Cr of 0.75). Liver Function Tests:  Recent Labs Lab 08/22/13 0840  AST 24  ALT 14  ALKPHOS 60  BILITOT 0.4  PROT 7.3  ALBUMIN 2.9*   CBC:  Recent Labs Lab 08/22/13 0840 08/23/13 0359  WBC 5.3 5.1  NEUTROABS 3.6  --   HGB 11.7* 9.9*  HCT 34.9* 29.9*  MCV 92.1 92.6  PLT 389 337   Cardiac Enzymes:  Recent Labs Lab 08/22/13 1422  CKTOTAL 58   D-Dimer  Recent Labs  08/22/13 1422  DDIMER 1.44*   Hgb A1c  Recent Labs  08/22/13 1422  HGBA1C 6.6*   Thyroid function studies  Recent Labs  08/22/13 1405  TSH 0.710   Microbiology No results found for this or any previous visit (from the past 240 hour(s)).   Procedures and Diagnostic Studies: Dg Chest Port 1 View  08/22/2013   CLINICAL DATA:  Unexplained fatigue.  Evaluate for mass.  EXAM: PORTABLE CHEST - 1 VIEW  COMPARISON:  None  FINDINGS: Normal heart size.  No pleural effusion or edema identified. Lungs are hyperinflated and there are coarsened interstitial markings noted bilaterally. No airspace consolidation identified.  The visualized bony structures appear intact.  IMPRESSION: 1. No acute findings. 2. Increased lung volumes and coarsened interstitial markings suggest COPD.   Electronically Signed   By: Signa Kell M.D.   On: 08/22/2013 18:25    Scheduled Meds: . B-complex with vitamin C  1 tablet Oral Daily  . enoxaparin (LOVENOX) injection  30 mg Subcutaneous Q24H  . feeding supplement (ENSURE COMPLETE)  237 mL Oral BID BM  . levothyroxine  125 mcg Oral QAC breakfast  . metoprolol tartrate  25 mg Oral Daily  . multivitamin  with minerals  1 tablet Oral Daily  . polyvinyl alcohol  1 drop Both Eyes QHS  . simvastatin  20 mg Oral QPC supper   Continuous Infusions: . sodium chloride 1,000 mL (08/23/13 0526)    Time spent: 35 minutes with > 50% of time discussing current diagnostic test results, clinical impression and plan of care.    LOS: 1 day   Sanjith Siwek  [facility] Pager [phone].   *Please note that the hospitalists switch teams on Wednesdays. Please call the flow manager at [phone] if you are having difficulty reaching the hospitalist taking care of this patient as she can update you and provide the most up-to-date pager number of provider caring for the patient. If 8PM-8AM, please contact night-coverage at [email or web address], password [facility]  08/23/2013, 7:21 AM

## 2013-08-23 NOTE — Progress Notes (Signed)
INITIAL NUTRITION ASSESSMENT  Pt meets criteria for severe MALNUTRITION in the context of chronic illness as evidenced by severe muscle wasting and subcutaneous fat loss in clavicles, upper arms, and hands.  DOCUMENTATION CODES Per approved criteria  -Severe malnutrition in the context of chronic illness -Underweight   INTERVENTION: - Ensure Complete BID - Discussed high calorie/protein diet with pt and encouraged her to add more calories to her diet to promote weight gain - Will continue to monitor   NUTRITION DIAGNOSIS: Increased nutrient needs related to underweight as evidenced by body mass index is 15.83 kg/(m^2).  Goal: Pt to consume >90% of meals/supplements  Monitor:  Weights, labs, intake  Reason for Assessment: Consult   79 y.o. female  Admitting Dx: Lower extremity weakness  ASSESSMENT: Pt discussed during multidisciplinary rounds. Pt is a 79 y.o. year-old female with history of hypertension, hyperlipidemia, and type 2 diabetes diet controlled, diverticulosis with history of iron deficiency anemia, hypothyroidism who presents with progressive lower extremity weakness, fatigue, frequent falls.  Met with pt who reports she normally eats well - has a big breakfast, soup/sandwich for lunch, and a cooked meal for dinner. Denies any problems chewing/swallowing. States she has lost 9 pounds unintentionally since June of this year. Not on any nutritional supplements at home.   Nutrition Focused Physical Exam:  Subcutaneous Fat:  Orbital Region: mild/moderate wasting Upper Arm Region: severe wasting Thoracic and Lumbar Region: severe wasting  Muscle:  Temple Region: mild/moderate wasting Clavicle Bone Region: severe wasting Clavicle and Acromion Bone Region: severe wasting Scapular Bone Region: NA Dorsal Hand: severe wasting Patellar Region: mild/moderate wasting Anterior Thigh Region: mild/moderate wasting Posterior Calf Region: mild/moderate wasting  Edema: None  noted     Height: Ht Readings from Last 1 Encounters:  08/22/13 5\' 6"  (1.676 m)    Weight: Wt Readings from Last 1 Encounters:  08/22/13 98 lb (44.453 kg)    Ideal Body Weight: 130 lb   % Ideal Body Weight: 75%  Wt Readings from Last 10 Encounters:  08/22/13 98 lb (44.453 kg)    Usual Body Weight: 107 lb in June 2014  % Usual Body Weight: 91%  BMI:  Body mass index is 15.83 kg/(m^2). Underweight  Estimated Nutritional Needs: Kcal: 1550-1650 Protein: 55-70g Fluid: 1.5-1.6L/day  Skin: intact   Diet Order: General  EDUCATION NEEDS: -No education needs identified at this time   Intake/Output Summary (Last 24 hours) at 08/23/13 1014 Last data filed at 08/23/13 0530  Gross per 24 hour  Intake   2399 ml  Output    450 ml  Net   1949 ml    Last BM: 12/2  Labs:   Recent Labs Lab 08/22/13 0840 08/23/13 0359  NA 132* 133*  K 4.2 3.9  CL 94* 101  CO2 28 25  BUN 14 12  CREATININE 0.85 0.75  CALCIUM 9.3 8.2*  GLUCOSE 109* 93    CBG (last 3)  No results found for this basename: GLUCAP,  in the last 72 hours  Scheduled Meds: . B-complex with vitamin C  1 tablet Oral Daily  . enoxaparin (LOVENOX) injection  30 mg Subcutaneous Q24H  . feeding supplement (ENSURE COMPLETE)  237 mL Oral BID BM  . levothyroxine  125 mcg Oral QAC breakfast  . metoprolol tartrate  25 mg Oral Daily  . multivitamin with minerals  1 tablet Oral Daily  . polyvinyl alcohol  1 drop Both Eyes QHS  . predniSONE  10 mg Oral BID WC  .  simvastatin  20 mg Oral QPC supper    Continuous Infusions:   Past Medical History  Diagnosis Date  . Hypothyroidism   . Hypertension   . Hyperlipidemia   . Diabetes mellitus without complication   . Anemia   . Arthritis   . Diverticulosis   . Constipation     Past Surgical History  Procedure Laterality Date  . Tonsillectomy    . Abdominal hysterectomy    . Cataract extraction, bilateral    . Esophagogastroduodenoscopy endoscopy   05/13/1998    Levon Hedger MS, RD, LDN [phone] Pager [phone] After Hours Pager

## 2013-08-23 NOTE — Evaluation (Addendum)
Occupational Therapy Evaluation Patient Details Name: Marcia Cooper MRN: [id number] DOB: 11-26-1919 Today's Date: 08/23/2013 Time: 4098-1191 OT Time Calculation (min): 21 min  OT Assessment / Plan / Recommendation History of present illness pt was admitted for LE weakness.  She has a h/o fall wtih upper thoracic pain, DM, CKD, and HTN.     Clinical Impression   Pt was admitted with the above.  She presents at min guard level for ambulation related to adls.  She is appropriate for skilled OT to increase independence with adls.  Goals in acute are for supervision level.      OT Assessment  Patient needs continued OT Services    Follow Up Recommendations  Home health OT (unless goals are met)    Barriers to Discharge      Equipment Recommendations   (pt is considering getting a shower seat/stool)    Recommendations for Other Services    Frequency  Min 2X/week    Precautions / Restrictions Precautions Precautions: Fall (states fell in august--had bronchitis) Restrictions Weight Bearing Restrictions: No   Pertinent Vitals/Pain No pain    ADL  Grooming: Teeth care;Supervision/safety Where Assessed - Grooming: Supported standing Upper Body Bathing: Set up Where Assessed - Upper Body Bathing: Unsupported sitting Lower Body Bathing: Supervision/safety Where Assessed - Lower Body Bathing: Supported sit to stand Upper Body Dressing: Set up Where Assessed - Upper Body Dressing: Unsupported sitting Lower Body Dressing: Supervision/safety Where Assessed - Lower Body Dressing: Supported sit to stand Toilet Transfer: Simulated;Min guard Statistician Method: Sit to stand Toileting - Architect and Hygiene: Simulated;Supervision/safety Where Assessed - Engineer, mining and Hygiene: Sit to stand from 3-in-1 or toilet Transfers/Ambulation Related to ADLs: pt ambulated around bed to sink and chair.  Pt tends to furniture walk.  She said she normally doesn't  do this except at night when she gets up.  Feels a little weaker than normal.   ADL Comments: Pt has been independent with all adls.  She states that she had 2 falls in August when she had bronchitis.  She denies other falls.  Pt's husband is 63 and is independent also.  They have a daughter nearby.    OT Diagnosis: Generalized weakness  OT Problem List: Decreased strength;Decreased activity tolerance OT Treatment Interventions: Self-care/ADL training;DME and/or AE instruction;Patient/family education   OT Goals(Current goals can be found in the care plan section) Acute Rehab OT Goals Patient Stated Goal: get back to being independent OT Goal Formulation: With patient Time For Goal Achievement: 09/06/13 Potential to Achieve Goals: Good ADL Goals Pt Will Transfer to Toilet: with supervision;ambulating (standard commode) Additional ADL Goal #1: pt will gather clothes at supervision level  Visit Information  Last OT Received On: 08/23/13 History of Present Illness: pt was admitted for LE weakness.  She has a h/o fall wtih upper thoracic pain, DM, CKD, and HTN.         Prior Functioning     Home Living Family/patient expects to be discharged to:: Private residence Living Arrangements: Spouse/significant other Type of Home: House Home Access: Stairs to enter Secretary/administrator of Steps: 1 Home Layout: One level Home Equipment: None Additional Comments: has been considering a stool for shower.  May be able to borrow a toilet rise Prior Function Level of Independence: Independent Communication Communication: No difficulties         Vision/Perception     Cognition  Cognition Arousal/Alertness: Awake/alert Behavior During Therapy: WFL for tasks assessed/performed Overall Cognitive Status: Within Functional  Limits for tasks assessed    Extremity/Trunk Assessment Upper Extremity Assessment Upper Extremity Assessment: Overall WFL for tasks assessed     Mobility Bed  Mobility Bed Mobility: Supine to Sit Supine to Sit: 5: Supervision;HOB flat Transfers Transfers: Sit to Stand;Stand to Sit Sit to Stand: 5: Supervision Stand to Sit: 5: Supervision     Exercise     Balance Balance Balance Assessed: Yes Static Standing Balance Static Standing - Balance Support: No upper extremity supported Static Standing - Level of Assistance:  (supervision) Static Standing - Comment/# of Minutes: 1 minute   End of Session OT - End of Session Activity Tolerance: Patient tolerated treatment well Patient left: in chair;with call bell/phone within reach  GO Functional Assessment Tool Used: clinical observation Functional Limitation: Self care Self Care Current Status (B1478): At least 1 percent but less than 20 percent impaired, limited or restricted Self Care Goal Status (G9562): At least 1 percent but less than 20 percent impaired, limited or restricted   Marcia Cooper 08/23/2013, 2:09 PM [name], OTR/L [phone] 08/23/2013

## 2013-08-23 NOTE — Plan of Care (Signed)
Problem: Phase I Progression Outcomes Goal: OOB as tolerated unless otherwise ordered Outcome: Completed/Met Date Met:  08/23/13 oob with supervision and minimal assist

## 2013-08-24 DIAGNOSIS — E43 Unspecified severe protein-calorie malnutrition: Secondary | ICD-10-CM | POA: Diagnosis present

## 2013-08-24 DIAGNOSIS — M353 Polymyalgia rheumatica: Principal | ICD-10-CM

## 2013-08-24 DIAGNOSIS — D649 Anemia, unspecified: Secondary | ICD-10-CM

## 2013-08-24 MED ORDER — PANTOPRAZOLE SODIUM 40 MG PO TBEC: 40.0000 mg | DELAYED_RELEASE_TABLET | Freq: Every day | ORAL | Status: DC

## 2013-08-24 MED ORDER — ENSURE COMPLETE PO LIQD: 237.0000 mL | Freq: Two times a day (BID) | ORAL | Status: DC

## 2013-08-24 MED ORDER — PREDNISONE 10 MG PO TABS: 10.0000 mg | ORAL_TABLET | Freq: Two times a day (BID) | ORAL | Status: DC

## 2013-08-24 NOTE — Progress Notes (Signed)
[facility] PROGRESS NOTE   Marcia Cooper WGN:[id number] DOB: 1919-10-24 DOA: 08/22/2013 PCP: Lorenda Peck, MD  Brief narrative: Marcia Cooper is an 79 y.o. female with PMH of hypertension, hyperlipidemia, and type 2 diabetes diet controlled, diverticulosis with history of iron deficiency anemia, and hypothyroidism who was admitted on 08/22/2013 with progressive lower extremity weakness, fatigue, and frequent falls. Initial evaluation emergency department was largely unremarkable except for some mild dehydration.   Assessment/Plan: Principal Problem:   Lower extremity weakness with recurrent falls The patient was admitted and a workup was initiated. TSH was normal at 0.710. CK was normal at 58. ESR was elevated at 94. Cortisol levels areWNL at 13.1. Patient has some chronic left subscapular pain, and it appears that she has been treated with a two-week course of prednisone 10 mg twice a day, I assume for suspected polymyalgia rheumatica. The patient does state that her symptoms improved on prednisone therapy. CRP checked and also found to be elevated at 1.9, consistent with PMR.  If PMR, symptoms are generally 50 to 70 percent better within three days of being started on prednisone (10 to 20 mg/day) and almost all patients respond completely within two weeks of initiation of therapy. She likely needs a more prolonged course and therefore we'll resume prednisone 10 mg twice a day. Two-dimensional echocardiogram also ordered, results pending. PT/OT evaluations performed 08/23/2013 with recommendations for home health PT. Active Problems:   Dehydration Hydrated overnight. Normal saline lock IV.   Sinus tachycardia Initially thought to be from dehydration, but I suspect this is related to anemia. D-dimer elevated at 1.44. No hypoxia noted. VQ scan negative for pulmonary embolism. No evidence of hyperthyroidism induced by over replacement of Synthroid. Continue metoprolol.    Normocytic anemia Almost 2 g drop in hemoglobin noted, likely dilutional. Suspect anemia of chronic disease given her elevated sedimentation rate. Given elevated globulin gap, multiple myeloma needs to be ruled out. Followup SPEP, UPEP/IFE. Anemia studies show RBC folate high at 1054, B12 high at 989, transferrin level, iron, TIBC and saturation ratios all low. Ferritin WNL at 166. These findings are consistent with anemia of chronic disease.   Hyponatremia Mild. Marginally improved with IV fluids.   Hypothyroidism Continue current dose of Synthroid, appropriately replaced.   Hypertension Continue metoprolol.   Hyperlipidemia Continue statin.   Diabetes mellitus without complication Diet controlled at home. Hemoglobin A1c 6.6% corresponding to a mean plasma glucose of 143.   Constipation Continue MiraLAX as needed.   CKD (chronic kidney disease) stage 3, GFR 30-59 ml/min Creatinine stable.   Severe protein-calorie malnutrition Continue Ensure supplements.  Code Status: Full. Family Communication: Johnny Bridge (daughter) by telephone. Disposition Plan: Home when stable.   IV access:  Peripheral IV.  Medical Consultants:  None.  Other Consultants:  Physical therapy  Occupational therapy  Anti-infectives:  None.  HPI/Subjective: Marcia Cooper tells me that she had a headache yesterday, but none today, frontal.  No temporal tenderness.  Feels constipated but declines my offer for a dulcolax suppository.  Objective: Filed Vitals:   08/23/13 1254 08/23/13 2200 08/23/13 2215 08/24/13 0542  BP: 115/60 137/79  130/96  Pulse: 90 110 104 93  Temp: 98.4 F (36.9 C) 98.3 F (36.8 C)  97.8 F (36.6 C)  TempSrc: Oral Oral  Oral  Resp: 16 16  14   Height:      Weight:      SpO2: 96% 97%  98%    Intake/Output Summary (Last 24 hours) at 08/24/13 7846 Last data  filed at 08/24/13 0546  Gross per 24 hour  Intake    760 ml  Output   2300 ml  Net  -1540 ml    Exam: Gen:   NAD Head: No temporal  Cardiovascular:  Tachycardic, regular, No M/R/G Respiratory:  Lungs CTAB Gastrointestinal:  Abdomen soft, NT/ND, + BS Extremities:  Trace edema bilaterally  Data Reviewed: asic Metabolic Panel:  Recent Labs Lab 08/22/13 0840 08/23/13 0359  NA 132* 133*  K 4.2 3.9  CL 94* 101  CO2 28 25  GLUCOSE 109* 93  BUN 14 12  CREATININE 0.85 0.75  CALCIUM 9.3 8.2*   GFR Estimated Creatinine Clearance: 32.2 ml/min (by C-G formula based on Cr of 0.75). Liver Function Tests:  Recent Labs Lab 08/22/13 0840  AST 24  ALT 14  ALKPHOS 60  BILITOT 0.4  PROT 7.3  ALBUMIN 2.9*   CBC:  Recent Labs Lab 08/22/13 0840 08/23/13 0359  WBC 5.3 5.1  NEUTROABS 3.6  --   HGB 11.7* 9.9*  HCT 34.9* 29.9*  MCV 92.1 92.6  PLT 389 337   Cardiac Enzymes:  Recent Labs Lab 08/22/13 1422  CKTOTAL 58   D-Dimer  Recent Labs  08/22/13 1422  DDIMER 1.44*   Hgb A1c  Recent Labs  08/22/13 1422  HGBA1C 6.6*   Thyroid function studies  Recent Labs  08/22/13 1405  TSH 0.710   Microbiology No results found for this or any previous visit (from the past 240 hour(s)).   Procedures and Diagnostic Studies: Dg Chest Port 1 View  08/22/2013   CLINICAL DATA:  Unexplained fatigue.  Evaluate for mass.  EXAM: PORTABLE CHEST - 1 VIEW  COMPARISON:  None  FINDINGS: Normal heart size.  No pleural effusion or edema identified. Lungs are hyperinflated and there are coarsened interstitial markings noted bilaterally. No airspace consolidation identified.  The visualized bony structures appear intact.  IMPRESSION: 1. No acute findings. 2. Increased lung volumes and coarsened interstitial markings suggest COPD.   Electronically Signed   By: Signa Kell M.D.   On: 08/22/2013 18:25    Scheduled Meds: . B-complex with vitamin C  1 tablet Oral Daily  . enoxaparin (LOVENOX) injection  30 mg Subcutaneous Q24H  . feeding supplement (ENSURE COMPLETE)  237 mL Oral BID BM  .  levothyroxine  125 mcg Oral QAC breakfast  . metoprolol tartrate  25 mg Oral Daily  . multivitamin with minerals  1 tablet Oral Daily  . polyvinyl alcohol  1 drop Both Eyes QHS  . predniSONE  10 mg Oral BID WC  . simvastatin  20 mg Oral QPC supper   Continuous Infusions:    Time spent: 35 minutes with > 50% of time discussing current diagnostic test results, clinical impression and plan of care with the patient and with her daughter by telephone.    LOS: 2 days   Alcee Sipos  [facility] Pager [phone].   *Please note that the hospitalists switch teams on Wednesdays. Please call the flow manager at [phone] if you are having difficulty reaching the hospitalist taking care of this patient as she can update you and provide the most up-to-date pager number of provider caring for the patient. If 8PM-8AM, please contact night-coverage at [email or web address], password [facility]  08/24/2013, 7:26 AM

## 2013-08-24 NOTE — Discharge Summary (Signed)
Physician Discharge Summary  Marcia Cooper WRU:[id number] DOB: November 17, 1919 DOA: 08/22/2013  PCP: Lorenda Peck, MD  Admit date: 08/22/2013 Discharge date: 08/24/2013  Recommendations for Outpatient Follow-up:  1. Please followup on outstanding test results including SPEP/UPEP/IFE and 2 D Echocardiogram. 2. F/U with PCP regarding recommendations for duration of prednisone therapy for treatment of relapsed PMR.  Discharge Diagnoses:  Principal Problem:    PMR (polymyalgia rheumatica) with lower extremity weakness and recurrent falls Active Problems:    Dehydration    Sinus tachycardia    Normocytic anemia    Hyponatremia    Hypothyroidism    Hypertension    Hyperlipidemia    Diabetes mellitus without complication    Constipation    Recurrent falls    CKD (chronic kidney disease) stage 3, GFR 30-59 ml/min    Protein-calorie malnutrition, severe   Discharge Condition: Stable.  Diet recommendation: Regular  History of present illness:  Marcia Cooper is an 79 y.o. female with PMH of hypertension, hyperlipidemia, and type 2 diabetes diet controlled, diverticulosis with history of iron deficiency anemia, and hypothyroidism who was admitted on 08/22/2013 with progressive lower extremity weakness, fatigue, and frequent falls. Initial evaluation emergency department was largely unremarkable except for some mild dehydration.  Hospital Course by problem:  Principal Problem:  Lower extremity weakness with recurrent falls  The patient was admitted and a workup was initiated. TSH was normal at 0.710. CK was normal at 58. ESR was elevated at 94. Cortisol levels areWNL at 13.1. Patient had some chronic left subscapular pain, and it appears that she has been treated with a two-week course of prednisone 10 mg twice a day, I assume for suspected polymyalgia rheumatica. The patient does state that her symptoms improved on prednisone therapy. CRP checked and also found to be  elevated at 1.9, consistent with PMR. If PMR, symptoms are generally 50 to 70 percent better within three days of being started on prednisone (10 to 20 mg/day) and almost all patients respond completely within two weeks of initiation of therapy. She likely needs a more prolonged course and therefore we'll resume prednisone 10 mg twice a day. Two-dimensional echocardiogram also ordered, results pending. PT/OT evaluations performed 08/23/2013 with recommendations for home health PT, which has been set up.  Active Problems:  Dehydration  Hydrated. Sinus tachycardia  Initially thought to be from dehydration, but I suspect this is related to anemia. D-dimer elevated at 1.44. No hypoxia noted. VQ scan negative for pulmonary embolism. No evidence of hyperthyroidism induced by over replacement of Synthroid. Continue metoprolol.  Normocytic anemia  Almost 2 g drop in hemoglobin noted, likely dilutional. Suspect anemia of chronic disease given her elevated sedimentation rate. Given elevated globulin gap, multiple myeloma needs to be ruled out. Followup SPEP, UPEP/IFE. Anemia studies show RBC folate high at 1054, B12 high at 989, transferrin level, iron, TIBC and saturation ratios all low. Ferritin WNL at 166. These findings are consistent with anemia of chronic disease.  Hyponatremia  Mild. Marginally improved with IV fluids.  Hypothyroidism  Continue current dose of Synthroid, appropriately replaced.  Hypertension  Continue metoprolol.  Hyperlipidemia  Continue statin.  Diabetes mellitus without complication  Diet controlled at home. Hemoglobin A1c 6.6% corresponding to a mean plasma glucose of 143.  Constipation  Continue MiraLAX as needed.  CKD (chronic kidney disease) stage 3, GFR 30-59 ml/min  Creatinine stable.  Severe protein-calorie malnutrition  Continue Ensure supplements.  Procedures:  2-D echocardiogram pending.  VQ scan done 08/23/2013: Normal.  Consultations:  None.  Discharge  Exam: Filed Vitals:   08/24/13 0542  BP: 130/96  Pulse: 93  Temp: 97.8 F (36.6 C)  Resp: 14   Filed Vitals:   08/23/13 1254 08/23/13 2200 08/23/13 2215 08/24/13 0542  BP: 115/60 137/79  130/96  Pulse: 90 110 104 93  Temp: 98.4 F (36.9 C) 98.3 F (36.8 C)  97.8 F (36.6 C)  TempSrc: Oral Oral  Oral  Resp: 16 16  14   Height:      Weight:      SpO2: 96% 97%  98%    Gen: NAD  Head: No temporal  Cardiovascular: Tachycardic, regular, No M/R/G  Respiratory: Lungs CTAB  Gastrointestinal: Abdomen soft, NT/ND, + BS  Extremities: Trace edema bilaterally   Discharge Instructions  Discharge Orders   Future Orders Complete By Expires   Call MD for:  As directed    Scheduling Instructions:     Headaches involving your temples, vision changes.   Diet general  As directed    Discharge instructions  As directed    Comments:     Follow up with your PCP regarding outstanding test results including the results of your two-dimensional echocardiogram, and blood tests.  You were cared for by Dr. Hillery Aldo  (a hospitalist) during your hospital stay. If you have any questions about your discharge medications or the care you received while you were in the hospital after you are discharged, you can call the unit and ask to speak with the hospitalist on call if the hospitalist that took care of you is not available. Once you are discharged, your primary care physician will handle any further medical issues. Please note that NO REFILLS for any discharge medications will be authorized once you are discharged, as it is imperative that you return to your primary care physician (or establish a relationship with a primary care physician if you do not have one) for your aftercare needs so that they can reassess your need for medications and monitor your lab values.  Any outstanding tests can be reviewed by your PCP at your follow up visit.  It is also important to review any medicine changes with your  PCP.  Please bring these d/c instructions with you to your next visit so your physician can review these changes with you.  If you do not have a primary care physician, you can call [phone] for a physician referral.  It is highly recommended that you obtain a PCP for hospital follow up.   Face-to-face encounter (required for Medicare/Medicaid patients)  As directed    Comments:     I Tej Murdaugh certify that this patient is under my care and that I, or a nurse practitioner or physician's assistant working with me, had a face-to-face encounter that meets the physician face-to-face encounter requirements with this patient on 08/24/2013. The encounter with the patient was in whole, or in part for the following medical condition(s) which is the primary reason for home health care (List medical condition): Polymyalgia rheumatica with deconditioning, weakness, and frequent falls.   Questions:     The encounter with the patient was in whole, or in part, for the following medical condition, which is the primary reason for home health care:  Polymyalgia rheumatica, weakness, freq falls   I certify that, based on my findings, the following services are medically necessary home health services:  Physical therapy   My clinical findings support the need for the above services:  Unable to leave home safely  without assistance and/or assistive device   Further, I certify that my clinical findings support that this patient is homebound due to:  Unable to leave home safely without assistance   Reason for Medically Necessary Home Health Services:  Therapy- Therapeutic Exercises to Increase Strength and Endurance   Home Health  As directed    Questions:     To provide the following care/treatments:  PT   Increase activity slowly  As directed        Medication List         acetaminophen 500 MG tablet  Commonly known as:  TYLENOL  Take 500 mg by mouth every 8 (eight) hours as needed for mild pain.     b  complex vitamins tablet  Take 1 tablet by mouth daily.     CALCIUM 500 PO  Take 500 mg by mouth daily.     feeding supplement (ENSURE COMPLETE) Liqd  Take 237 mLs by mouth 2 (two) times daily between meals.     levothyroxine 125 MCG tablet  Commonly known as:  SYNTHROID, LEVOTHROID  Take 125 mcg by mouth daily before breakfast.     metoprolol tartrate 25 MG tablet  Commonly known as:  LOPRESSOR  Take 25 mg by mouth daily.     ONE-A-DAY WOMENS PO  Take 1 tablet by mouth daily.     pantoprazole 40 MG tablet  Commonly known as:  PROTONIX  Take 1 tablet (40 mg total) by mouth daily.     polyethylene glycol packet  Commonly known as:  MIRALAX / GLYCOLAX  Take 17 g by mouth daily as needed for mild constipation.     predniSONE 10 MG tablet  Commonly known as:  DELTASONE  Take 1 tablet (10 mg total) by mouth 2 (two) times daily with a meal.     REFRESH TEARS 0.5 % Soln  Generic drug:  carboxymethylcellulose  Place 1 drop into both eyes at bedtime.     simvastatin 20 MG tablet  Commonly known as:  ZOCOR  Take 20 mg by mouth daily.           Follow-up Information   Follow up with ROBERTS, Vernie Ammons, MD. Schedule an appointment as soon as possible for a visit in 1 week. [facility] follow up)    Specialty:  Internal Medicine   Contact information:   [address] [place] [address] [phone]        The results of significant diagnostics from this hospitalization (including imaging, microbiology, ancillary and laboratory) are listed below for reference.    Significant Diagnostic Studies: Nm Pulmonary Perf And Vent  08/23/2013   CLINICAL DATA:  Elevated D-dimer.  Tachycardia.  EXAM: NUCLEAR MEDICINE VENTILATION - PERFUSION LUNG SCAN  TECHNIQUE: Ventilation images were obtained in multiple projections using inhaled aerosol technetium 99 M DTPA. Perfusion images were obtained in multiple projections after intravenous injection of Tc-58m MAA.  COMPARISON:   Chest x-ray 08/22/2013.  RADIOPHARMACEUTICALS:  44.0 mCi Tc-54m DTPA aerosol and 5.1 mCi Tc-60m MAA  FINDINGS: Ventilation: No focal ventilation defect.  Perfusion: No wedge shaped peripheral perfusion defects to suggest acute pulmonary embolism.  IMPRESSION: Normal exam.   Electronically Signed   By: Maisie Fus  Register   On: 08/23/2013 09:09   Dg Chest Port 1 View  08/22/2013   CLINICAL DATA:  Unexplained fatigue.  Evaluate for mass.  EXAM: PORTABLE CHEST - 1 VIEW  COMPARISON:  None  FINDINGS: Normal heart size.  No pleural  effusion or edema identified. Lungs are hyperinflated and there are coarsened interstitial markings noted bilaterally. No airspace consolidation identified.  The visualized bony structures appear intact.  IMPRESSION: 1. No acute findings. 2. Increased lung volumes and coarsened interstitial markings suggest COPD.   Electronically Signed   By: Signa Kell M.D.   On: 08/22/2013 18:25    Labs:  Basic Metabolic Panel:  Recent Labs Lab 08/22/13 0840 08/23/13 0359  NA 132* 133*  K 4.2 3.9  CL 94* 101  CO2 28 25  GLUCOSE 109* 93  BUN 14 12  CREATININE 0.85 0.75  CALCIUM 9.3 8.2*   GFR Estimated Creatinine Clearance: 32.2 ml/min (by C-G formula based on Cr of 0.75). Liver Function Tests:  Recent Labs Lab 08/22/13 0840  AST 24  ALT 14  ALKPHOS 60  BILITOT 0.4  PROT 7.3  ALBUMIN 2.9*   CBC:  Recent Labs Lab 08/22/13 0840 08/23/13 0359  WBC 5.3 5.1  NEUTROABS 3.6  --   HGB 11.7* 9.9*  HCT 34.9* 29.9*  MCV 92.1 92.6  PLT 389 337   Cardiac Enzymes:  Recent Labs Lab 08/22/13 1422  CKTOTAL 58   D-Dimer  Recent Labs  08/22/13 1422  DDIMER 1.44*   Hgb A1c  Recent Labs  08/22/13 1422  HGBA1C 6.6*   Thyroid function studies  Recent Labs  08/22/13 1405  TSH 0.710   Anemia work up  Recent Labs  08/23/13 0359  VITAMINB12 989*  FERRITIN 166  TIBC 188*  IRON 30*    Time coordinating discharge: 35  minutes.  Signed:  Aislee Landgren  Pager [phone] [facility] 08/24/2013, 10:12 AM

## 2013-08-24 NOTE — Progress Notes (Signed)
Patient medically stable. DC instructions given to patient, husband and daughter. IV site still red and inflamed from IV removal. Gave patient heat pack and told her to notify MD if site did not start to improve.

## 2013-08-24 NOTE — Progress Notes (Signed)
Physical Therapy Treatment Patient Details Name: Lakendra Helling MRN: [id number] DOB: 04-15-1920 Today's Date: 08/24/2013 Time: 7829-5621 PT Time Calculation (min): 23 min  PT Assessment / Plan / Recommendation  History of Present Illness Thyra Yinger is an 79 y.o. female with PMH of hypertension, hyperlipidemia, and type 2 diabetes diet controlled, diverticulosis with history of iron deficiency anemia, and hypothyroidism who was admitted on 08/22/2013 with progressive lower extremity weakness, fatigue, and frequent falls. Initial evaluation emergency department was largely unremarkable except for some mild dehydration.   PT Comments   Pt ambulated in hallway without assistive device and performed LE exercises in sitting and standing.  Pt reports feeling better today and hopeful for d/c home.  Follow Up Recommendations  Home health PT     Does the patient have the potential to tolerate intense rehabilitation     Barriers to Discharge        Equipment Recommendations  Other (comment) (pt declines SPC)    Recommendations for Other Services    Frequency Min 3X/week   Progress towards PT Goals Progress towards PT goals: Progressing toward goals  Plan Current plan remains appropriate    Precautions / Restrictions Precautions Precautions: Fall   Pertinent Vitals/Pain Denies pain    Mobility  Bed Mobility Details for Bed Mobility Assistance: pt up in recliner on arrival Transfers Transfers: Stand to Sit;Sit to Stand Sit to Stand: 5: Supervision;With upper extremity assist;From chair/3-in-1 Stand to Sit: 5: Supervision;To chair/3-in-1;With upper extremity assist Ambulation/Gait Ambulation/Gait Assistance: 4: Min guard;5: Supervision Ambulation Distance (Feet): 400 Feet Assistive device: None Ambulation/Gait Assistance Details: pt with improved steadiness today however still not feeling 100%, pt declined SPC training and feels she will return to baseline soon Gait Pattern:  Step-through pattern;Decreased trunk rotation;Narrow base of support;Decreased stride length Gait velocity: decr    Exercises General Exercises - Lower Extremity Long Arc Quad: AROM;Both;15 reps;Seated Hip ABduction/ADduction: Standing;AROM;Both;15 reps;Other (comment) (UE support) Straight Leg Raises: AROM;15 reps;Supine;Both Hip Flexion/Marching: AROM;Seated;Both;15 reps Heel Raises: AROM;Standing;Other (comment);Both;15 reps (UE support) Other Exercises Other Exercises: ham curls bilaterally x15 standing with UE support Other Exercises: hip extension bilaterally x15 standing with UE support   PT Diagnosis:    PT Problem List:   PT Treatment Interventions:     PT Goals (current goals can now be found in the care plan section)    Visit Information  Last PT Received On: 08/24/13 Assistance Needed: +1 History of Present Illness: Nicolet Griffy is an 79 y.o. female with PMH of hypertension, hyperlipidemia, and type 2 diabetes diet controlled, diverticulosis with history of iron deficiency anemia, and hypothyroidism who was admitted on 08/22/2013 with progressive lower extremity weakness, fatigue, and frequent falls. Initial evaluation emergency department was largely unremarkable except for some mild dehydration.    Subjective Data      Cognition  Cognition Arousal/Alertness: Awake/alert Behavior During Therapy: WFL for tasks assessed/performed Overall Cognitive Status: Within Functional Limits for tasks assessed    Balance     End of Session PT - End of Session Activity Tolerance: Patient tolerated treatment well Patient left: with call bell/phone within reach;in chair   GP     Elery Cadenhead,KATHrine E 08/24/2013, 11:24 AM Zenovia Jarred, PT, DPT 08/24/2013 Pager: [phone]

## 2013-08-24 NOTE — Care Management Note (Signed)
Cm spoke with patient at the bedside concerning discharge planning. Pt offered choice for [facility]. Per pt choice [facility] to provide [facility] services at discharge. Pt resides home with spouse. Pt has adult children and grand children who assist in home care. Pt uses [facility] on [address]. Pt request cane. [facility] notified of HH and DME needs.    Roxy Manns Steffan Caniglia,RN,MSN [phone]

## 2013-08-25 ENCOUNTER — Telehealth: Payer: Self-pay | Admitting: Internal Medicine

## 2013-08-25 NOTE — Telephone Encounter (Signed)
Let patient know she has CHF with EF of 40-45% on her ECHO from admission.  She already eats a low salt diet.  Denies SOB, orthopnea.  She will follow up with her PCP at her already scheduled appointment for Monday.  IFE/SPEP still pending.

## 2013-08-25 NOTE — Telephone Encounter (Signed)
ECHO

## 2013-08-27 LAB — PROTEIN ELECTROPHORESIS, SERUM
Albumin ELP: 45.6 % — ABNORMAL LOW (ref 55.8–66.1)
Alpha-1-Globulin: 8.9 % — ABNORMAL HIGH (ref 2.9–4.9)
Alpha-2-Globulin: 14.5 % — ABNORMAL HIGH (ref 7.1–11.8)
Beta 2: 3.2 % (ref 3.2–6.5)
Beta Globulin: 5.5 % (ref 4.7–7.2)
M-Spike, %: 0.98 g/dL
Total Protein ELP: 5.4 g/dL — ABNORMAL LOW (ref 6.0–8.3)

## 2013-08-27 LAB — UIFE/LIGHT CHAINS/TP QN, 24-HR UR
Alpha 1, Urine: DETECTED — AB
Alpha 2, Urine: DETECTED — AB
Free Kappa/Lambda Ratio: 2.74 ratio (ref 2.04–10.37)
Total Protein, Urine: 1.7 mg/dL

## 2013-11-28 ENCOUNTER — Telehealth: Payer: Self-pay | Admitting: Hematology and Oncology

## 2013-11-28 NOTE — Telephone Encounter (Signed)
S/W PATIENT AND GAVE NEW PATIENT APPT FOR 03/23 @ 10:45 W/DR. Dyer.  REFERRING DR. [place] PACKET MAILED.

## 2013-11-28 NOTE — Telephone Encounter (Signed)
C/D 11/28/13 for appt. 12/10/13

## 2013-12-10 ENCOUNTER — Ambulatory Visit (HOSPITAL_BASED_OUTPATIENT_CLINIC_OR_DEPARTMENT_OTHER): Payer: Medicare Other | Admitting: Hematology and Oncology

## 2013-12-10 ENCOUNTER — Ambulatory Visit: Payer: Medicare Other

## 2013-12-10 ENCOUNTER — Encounter: Payer: Self-pay | Admitting: Hematology and Oncology

## 2013-12-10 ENCOUNTER — Telehealth: Payer: Self-pay | Admitting: Hematology and Oncology

## 2013-12-10 VITALS — BP 145/64 | HR 86 | Temp 98.0°F | Resp 18 | Ht 66.0 in | Wt 98.3 lb

## 2013-12-10 DIAGNOSIS — D472 Monoclonal gammopathy: Secondary | ICD-10-CM

## 2013-12-10 DIAGNOSIS — D539 Nutritional anemia, unspecified: Secondary | ICD-10-CM

## 2013-12-10 DIAGNOSIS — D649 Anemia, unspecified: Secondary | ICD-10-CM

## 2013-12-10 DIAGNOSIS — M549 Dorsalgia, unspecified: Secondary | ICD-10-CM

## 2013-12-10 DIAGNOSIS — G8929 Other chronic pain: Secondary | ICD-10-CM

## 2013-12-10 HISTORY — DX: Nutritional anemia, unspecified: D53.9

## 2013-12-10 HISTORY — DX: Monoclonal gammopathy: D47.2

## 2013-12-10 NOTE — Progress Notes (Signed)
Checked in new pt with no financial concerns. °

## 2013-12-10 NOTE — Progress Notes (Signed)
[facility] CONSULT NOTE  Patient Care Team: Lorene Dy, MD as PCP - General (Internal Medicine)  CHIEF COMPLAINTS/PURPOSE OF CONSULTATION:  MGUS  HISTORY OF PRESENTING ILLNESS:  Marcia Cooper 80 y.o. female is here because of MGUS detected on routine blood work. The patient recently had severe dehydration requiring hospitalization. She was also mildly anemic. She had history of severe back pain for 6 months, resolved recently. She denies history of abnormal bone fracture. Patient denies history of recurrent infection or atypical infections such as shingles of meningitis. Denies chills, night sweats, anorexia or abnormal weight loss.  MEDICAL HISTORY:  Past Medical History  Diagnosis Date  . Hypothyroidism   . Hypertension   . Hyperlipidemia   . Diabetes mellitus without complication   . Anemia   . Arthritis   . Diverticulosis   . Constipation   . MGUS (monoclonal gammopathy of unknown significance) 12/10/2013  . Unspecified deficiency anemia 12/10/2013    SURGICAL HISTORY: Past Surgical History  Procedure Laterality Date  . Tonsillectomy    . Abdominal hysterectomy    . Cataract extraction, bilateral    . Esophagogastroduodenoscopy endoscopy  05/13/1998    SOCIAL HISTORY: History   Social History  . Marital Status: Married    Spouse Name: N/A    Number of Children: N/A  . Years of Education: N/A   Occupational History  . Not on file.   Social History Main Topics  . Smoking status: Never Smoker   . Smokeless tobacco: Never Used  . Alcohol Use: No  . Drug Use: No  . Sexual Activity: No   Other Topics Concern  . Not on file   Social History Narrative   Lives with her husband and ambulates without assist device    FAMILY HISTORY: Family History  Problem Relation Age of Onset  . Colon cancer Mother   . Heart disease Father   . Thyroid disease Daughter   . Thyroid disease Daughter     ALLERGIES:  has No Known  Allergies.  MEDICATIONS:  Current Outpatient Prescriptions  Medication Sig Dispense Refill  . acetaminophen (TYLENOL) 500 MG tablet Take 500 mg by mouth every 8 (eight) hours as needed for mild pain.      Marland Kitchen b complex vitamins tablet Take 1 tablet by mouth daily.      . Calcium-Magnesium-Vitamin D (CALCIUM 500 PO) Take 500 mg by mouth daily.      . carboxymethylcellulose (REFRESH TEARS) 0.5 % SOLN Place 1 drop into both eyes at bedtime.      Marland Kitchen levothyroxine (SYNTHROID, LEVOTHROID) 125 MCG tablet Take 125 mcg by mouth daily before breakfast.      . methylPREDNISolone (MEDROL) 4 MG tablet       . metoprolol tartrate (LOPRESSOR) 25 MG tablet Take 25 mg by mouth daily.      . polyethylene glycol (MIRALAX / GLYCOLAX) packet Take 17 g by mouth daily as needed for mild constipation.      . Multiple Vitamins-Calcium (ONE-A-DAY WOMENS PO) Take 1 tablet by mouth daily.      . simvastatin (ZOCOR) 20 MG tablet Take 20 mg by mouth daily.       No current facility-administered medications for this visit.    REVIEW OF SYSTEMS:   Eyes: Denies blurriness of vision, double vision or watery eyes Ears, nose, mouth, throat, and face: Denies mucositis or sore throat Respiratory: Denies cough, dyspnea or wheezes Cardiovascular: Denies palpitation, chest discomfort or lower extremity swelling Gastrointestinal:  Denies  nausea, heartburn or change in bowel habits Skin: Denies abnormal skin rashes Lymphatics: Denies new lymphadenopathy or easy bruising Neurological:Denies numbness, tingling or new weaknesses Behavioral/Psych: Mood is stable, no new changes  All other systems were reviewed with the patient and are negative.  PHYSICAL EXAMINATION: ECOG PERFORMANCE STATUS: 0 - Asymptomatic  Filed Vitals:   12/10/13 1042  BP: 145/64  Pulse: 86  Temp: 98 F (36.7 C)  Resp: 18   Filed Weights   12/10/13 1042  Weight: 98 lb 4.8 oz (44.589 kg)    GENERAL:alert, no distress and comfortable. She looked  elderly, thin and cachectic SKIN: skin color, texture, turgor are normal, no rashes or significant lesions EYES: normal, conjunctiva are pink and non-injected, sclera clear OROPHARYNX:no exudate, no erythema and lips, buccal mucosa, and tongue normal  NECK: supple, thyroid normal size, non-tender, without nodularity LYMPH:  no palpable lymphadenopathy in the cervical, axillary or inguinal LUNGS: clear to auscultation and percussion with normal breathing effort HEART: regular rate & rhythm and no murmurs and no lower extremity edema ABDOMEN:abdomen soft, non-tender and normal bowel sounds Musculoskeletal:no cyanosis of digits and no clubbing  PSYCH: alert & oriented x 3 with fluent speech NEURO: no focal motor/sensory deficits  LABORATORY DATA:  I have reviewed the data as listed Lab Results  Component Value Date   WBC 5.1 08/23/2013   HGB 9.9* 08/23/2013   HCT 29.9* 08/23/2013   MCV 92.6 08/23/2013   PLT 337 08/23/2013    ASSESSMENT:  We discussed the approach for MGUS  PLAN:  #1 MGUS To rule out multiple myeloma, I recommend complete blood work, 24 hour urine collection for UPEP and skeletal survey to rule out multiple myeloma #2 anemia This is likely anemia of chronic disease. The patient denies recent history of bleeding such as epistaxis, hematuria or hematochezia. She is asymptomatic from the anemia. We will observe for now.  She does not require transfusion now.  I will order some additional workup for this #3 Chronic back pain I suspect has osteopenia/osteoporosis. She will continue calcium with vitamin D supplements for now.  Orders Placed This Encounter  Procedures  . DG Bone Survey Met    Standing Status: Future     Number of Occurrences:      Standing Expiration Date: 02/09/2015    Order Specific Question:  Reason for Exam (SYMPTOM  OR DIAGNOSIS REQUIRED)    Answer:  MGUS, back pain, exclude myeloma    Order Specific Question:  Preferred imaging location?    Answer:   [facility]  . Comprehensive metabolic panel    Standing Status: Future     Number of Occurrences:      Standing Expiration Date: 02/09/2015  . CBC & Diff and Retic    Standing Status: Future     Number of Occurrences:      Standing Expiration Date: 02/09/2015  . Beta 2 microglobuline, serum    Standing Status: Future     Number of Occurrences:      Standing Expiration Date: 02/09/2015  . Sedimentation rate    Standing Status: Future     Number of Occurrences:      Standing Expiration Date: 02/09/2015  . SPEP & IFE with QIG    Standing Status: Future     Number of Occurrences:      Standing Expiration Date: 02/09/2015  . Kappa/lambda light chains    Standing Status: Future     Number of Occurrences:  Standing Expiration Date: 02/09/2015  . Protein Electro, 24-Hour Urine    Standing Status: Future     Number of Occurrences:      Standing Expiration Date: 12/10/2014  . IFE, Urine (with Tot Prot)    Standing Status: Future     Number of Occurrences:      Standing Expiration Date: 12/10/2014    All questions were answered. The patient knows to call the clinic with any problems, questions or concerns. I spent 40 minutes counseling the patient face to face. The total time spent in the appointment was 55 minutes and more than 50% was on counseling.     [place], [place], MD 12/10/2013 11:30 AM

## 2013-12-10 NOTE — Telephone Encounter (Signed)
gv and printed appt sched and avs for pt for March and April °

## 2013-12-12 ENCOUNTER — Other Ambulatory Visit (HOSPITAL_BASED_OUTPATIENT_CLINIC_OR_DEPARTMENT_OTHER): Payer: Medicare Other

## 2013-12-12 ENCOUNTER — Ambulatory Visit (HOSPITAL_COMMUNITY)
Admission: RE | Admit: 2013-12-12 | Discharge: 2013-12-12 | Disposition: A | Discharge status: Home / Self Care | Payer: Medicare Other | Source: Ambulatory Visit | Attending: Hematology and Oncology | Admitting: Hematology and Oncology

## 2013-12-12 DIAGNOSIS — D539 Nutritional anemia, unspecified: Secondary | ICD-10-CM

## 2013-12-12 DIAGNOSIS — D649 Anemia, unspecified: Secondary | ICD-10-CM

## 2013-12-12 DIAGNOSIS — M549 Dorsalgia, unspecified: Secondary | ICD-10-CM | POA: Insufficient documentation

## 2013-12-12 DIAGNOSIS — D472 Monoclonal gammopathy: Secondary | ICD-10-CM | POA: Insufficient documentation

## 2013-12-12 DIAGNOSIS — J984 Other disorders of lung: Secondary | ICD-10-CM | POA: Insufficient documentation

## 2013-12-12 DIAGNOSIS — M899 Disorder of bone, unspecified: Secondary | ICD-10-CM | POA: Insufficient documentation

## 2013-12-12 DIAGNOSIS — M47812 Spondylosis without myelopathy or radiculopathy, cervical region: Secondary | ICD-10-CM | POA: Insufficient documentation

## 2013-12-12 DIAGNOSIS — M949 Disorder of cartilage, unspecified: Secondary | ICD-10-CM

## 2013-12-12 LAB — COMPREHENSIVE METABOLIC PANEL (CC13)
ALBUMIN: 3.8 g/dL (ref 3.5–5.0)
ALT: 17 U/L (ref 0–55)
AST: 23 U/L (ref 5–34)
Alkaline Phosphatase: 72 U/L (ref 40–150)
Anion Gap: 11 mEq/L (ref 3–11)
BUN: 28.1 mg/dL — AB (ref 7.0–26.0)
CALCIUM: 9.8 mg/dL (ref 8.4–10.4)
CHLORIDE: 99 meq/L (ref 98–109)
CO2: 29 mEq/L (ref 22–29)
Creatinine: 0.9 mg/dL (ref 0.6–1.1)
Glucose: 95 mg/dl (ref 70–140)
POTASSIUM: 4.1 meq/L (ref 3.5–5.1)
Sodium: 138 mEq/L (ref 136–145)
Total Bilirubin: 0.37 mg/dL (ref 0.20–1.20)
Total Protein: 7.8 g/dL (ref 6.4–8.3)

## 2013-12-12 LAB — CBC & DIFF AND RETIC
BASO%: 0.1 % (ref 0.0–2.0)
Basophils Absolute: 0 10*3/uL (ref 0.0–0.1)
EOS ABS: 0.1 10*3/uL (ref 0.0–0.5)
EOS%: 0.9 % (ref 0.0–7.0)
HCT: 39.6 % (ref 34.8–46.6)
HGB: 12.9 g/dL (ref 11.6–15.9)
Immature Retic Fract: 1.2 % — ABNORMAL LOW (ref 1.60–10.00)
LYMPH%: 28.9 % (ref 14.0–49.7)
MCH: 29.9 pg (ref 25.1–34.0)
MCHC: 32.6 g/dL (ref 31.5–36.0)
MCV: 91.7 fL (ref 79.5–101.0)
MONO#: 0.8 10*3/uL (ref 0.1–0.9)
MONO%: 11 % (ref 0.0–14.0)
NEUT#: 4.4 10*3/uL (ref 1.5–6.5)
NEUT%: 59.1 % (ref 38.4–76.8)
Platelets: 226 10*3/uL (ref 145–400)
RBC: 4.32 10*6/uL (ref 3.70–5.45)
RDW: 14.4 % (ref 11.2–14.5)
Retic %: 0.88 % (ref 0.70–2.10)
Retic Ct Abs: 38.02 10*3/uL (ref 33.70–90.70)
WBC: 7.4 10*3/uL (ref 3.9–10.3)
lymph#: 2.1 10*3/uL (ref 0.9–3.3)

## 2013-12-14 LAB — UIFE/LIGHT CHAINS/TP QN, 24-HR UR
Albumin, U: DETECTED
Alpha 1, Urine: DETECTED — AB
Alpha 2, Urine: DETECTED — AB
Beta, Urine: DETECTED — AB
FREE KAPPA LT CHAINS, UR: 1.37 mg/dL (ref 0.14–2.42)
FREE LAMBDA LT CHAINS, UR: 0.3 mg/dL (ref 0.02–0.67)
FREE LT CHN EXCR RATE: 20.55 mg/d
Free Kappa/Lambda Ratio: 4.57 ratio (ref 2.04–10.37)
Free Lambda Excretion/Day: 4.5 mg/d
GAMMA UR: DETECTED — AB
Time: 24 hours
Total Protein, Urine-Ur/day: 33 mg/d (ref 10–140)
Total Protein, Urine: 2.2 mg/dL
VOLUME, URINE-UPE24: 1500 mL

## 2013-12-14 LAB — UPEP/TP, 24-HR URINE
Collection Interval: 24 hours
Total Protein, Urine: <3 mg/dL
Total Volume, Urine: 1500 mL

## 2013-12-14 LAB — KAPPA/LAMBDA LIGHT CHAINS
Kappa free light chain: 2.19 mg/dL — ABNORMAL HIGH (ref 0.33–1.94)
Kappa:Lambda Ratio: 0.13 — ABNORMAL LOW (ref 0.26–1.65)
Lambda Free Lght Chn: 16.9 mg/dL — ABNORMAL HIGH (ref 0.57–2.63)

## 2013-12-14 LAB — SPEP & IFE WITH QIG
Albumin ELP: 54.6 % — ABNORMAL LOW (ref 55.8–66.1)
Alpha-1-Globulin: 6.2 % — ABNORMAL HIGH (ref 2.9–4.9)
Alpha-2-Globulin: 10.3 % (ref 7.1–11.8)
BETA 2: 2.2 % — AB (ref 3.2–6.5)
Beta Globulin: 6.3 % (ref 4.7–7.2)
GAMMA GLOBULIN: 20.4 % — AB (ref 11.1–18.8)
IGA: 121 mg/dL (ref 69–380)
IGG (IMMUNOGLOBIN G), SERUM: 1850 mg/dL — AB (ref 690–1700)
IGM, SERUM: 46 mg/dL — AB (ref 52–322)
M-Spike, %: 1.2 g/dL
Total Protein, Serum Electrophoresis: 7.3 g/dL (ref 6.0–8.3)

## 2013-12-14 LAB — SEDIMENTATION RATE: SED RATE: 11 mm/h (ref 0–22)

## 2013-12-14 LAB — BETA 2 MICROGLOBULIN, SERUM: BETA 2 MICROGLOBULIN: 3.54 mg/L — AB (ref <=2.51)

## 2013-12-26 ENCOUNTER — Encounter: Payer: Self-pay | Admitting: Hematology and Oncology

## 2013-12-26 ENCOUNTER — Ambulatory Visit (HOSPITAL_BASED_OUTPATIENT_CLINIC_OR_DEPARTMENT_OTHER): Payer: Medicare Other | Admitting: Hematology and Oncology

## 2013-12-26 VITALS — BP 135/61 | HR 96 | Temp 97.9°F | Resp 18 | Ht 66.0 in | Wt 102.6 lb

## 2013-12-26 DIAGNOSIS — M810 Age-related osteoporosis without current pathological fracture: Secondary | ICD-10-CM

## 2013-12-26 DIAGNOSIS — M549 Dorsalgia, unspecified: Secondary | ICD-10-CM

## 2013-12-26 DIAGNOSIS — D472 Monoclonal gammopathy: Secondary | ICD-10-CM

## 2013-12-26 DIAGNOSIS — G8929 Other chronic pain: Secondary | ICD-10-CM

## 2013-12-26 NOTE — Progress Notes (Signed)
[place] OFFICE PROGRESS NOTE  Patient Care Team: Lorene Dy, MD as PCP - General (Internal Medicine)  DIAGNOSIS: IgG lambda MGUS  SUMMARY OF ONCOLOGIC HISTORY: This patient was diagnosed of MGUS detected on routine blood work. The patient recently had severe dehydration requiring hospitalization. She was also mildly anemic. She had history of severe back pain for 6 months, resolved recently. 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  INTERVAL HISTORY: Marcia Cooper 80 y.o. female returns for further followup. She has no new symptoms.  I have reviewed the past medical history, past surgical history, social history and family history with the patient and they are unchanged from previous note.  ALLERGIES:  has No Known Allergies.  MEDICATIONS:  Current Outpatient Prescriptions  Medication Sig Dispense Refill  . acetaminophen (TYLENOL) 500 MG tablet Take 500 mg by mouth every 8 (eight) hours as needed for mild pain.      Marland Kitchen b complex vitamins tablet Take 1 tablet by mouth daily.      . Calcium-Magnesium-Vitamin D (CALCIUM 500 PO) Take 500 mg by mouth daily.      . carboxymethylcellulose (REFRESH TEARS) 0.5 % SOLN Place 1 drop into both eyes at bedtime.      Marland Kitchen levothyroxine (SYNTHROID, LEVOTHROID) 125 MCG tablet Take 125 mcg by mouth daily before breakfast.      . metoprolol tartrate (LOPRESSOR) 25 MG tablet Take 25 mg by mouth daily.      . Multiple Vitamins-Calcium (ONE-A-DAY WOMENS PO) Take 1 tablet by mouth daily.      . polyethylene glycol (MIRALAX / GLYCOLAX) packet Take 17 g by mouth daily as needed for mild constipation.      . simvastatin (ZOCOR) 20 MG tablet Take 20 mg by mouth daily.       No current facility-administered medications for this visit.    REVIEW OF SYSTEMS:   Constitutional: Denies fevers, chills or abnormal weight loss All other systems were reviewed with the patient and are negative.  PHYSICAL  EXAMINATION: ECOG PERFORMANCE STATUS: 0 - Asymptomatic  Filed Vitals:   12/26/13 1052  BP: 135/61  Pulse: 96  Temp: 97.9 F (36.6 C)  Resp: 18   Filed Weights   12/26/13 1052  Weight: 102 lb 9.6 oz (46.539 kg)    GENERAL:alert, no distress and comfortable. She looks thin and mildly cachectic Musculoskeletal:no cyanosis of digits and no clubbing  NEURO: alert & oriented x 3 with fluent speech, no focal motor/sensory deficits  LABORATORY DATA:  I have reviewed the data as listed    Component Value Date/Time   NA 138 12/12/2013 1055   NA 133* 08/23/2013 0359   K 4.1 12/12/2013 1055   K 3.9 08/23/2013 0359   CL 101 08/23/2013 0359   CO2 29 12/12/2013 1055   CO2 25 08/23/2013 0359   GLUCOSE 95 12/12/2013 1055   GLUCOSE 93 08/23/2013 0359   BUN 28.1* 12/12/2013 1055   BUN 12 08/23/2013 0359   CREATININE 0.9 12/12/2013 1055   CREATININE 0.75 08/23/2013 0359   CALCIUM 9.8 12/12/2013 1055   CALCIUM 8.2* 08/23/2013 0359   PROT 7.8 12/12/2013 1055   PROT 7.3 08/22/2013 0840   ALBUMIN 3.8 12/12/2013 1055   ALBUMIN 2.9* 08/22/2013 0840   AST 23 12/12/2013 1055   AST 24 08/22/2013 0840   ALT 17 12/12/2013 1055   ALT 14 08/22/2013 0840   ALKPHOS 72 12/12/2013 1055   ALKPHOS 60 08/22/2013 0840   BILITOT 0.37  12/12/2013 1055   BILITOT 0.4 08/22/2013 0840   GFRNONAA 72* 08/23/2013 0359   GFRAA 83* 08/23/2013 0359    No results found for this basename: SPEP,  UPEP,   kappa and lambda light chains    Lab Results  Component Value Date   WBC 7.4 12/12/2013   NEUTROABS 4.4 12/12/2013   HGB 12.9 12/12/2013   HCT 39.6 12/12/2013   MCV 91.7 12/12/2013   PLT 226 12/12/2013      Chemistry      Component Value Date/Time   NA 138 12/12/2013 1055   NA 133* 08/23/2013 0359   K 4.1 12/12/2013 1055   K 3.9 08/23/2013 0359   CL 101 08/23/2013 0359   CO2 29 12/12/2013 1055   CO2 25 08/23/2013 0359   BUN 28.1* 12/12/2013 1055   BUN 12 08/23/2013 0359   CREATININE 0.9 12/12/2013 1055   CREATININE 0.75 08/23/2013 0359       Component Value Date/Time   CALCIUM 9.8 12/12/2013 1055   CALCIUM 8.2* 08/23/2013 0359   ALKPHOS 72 12/12/2013 1055   ALKPHOS 60 08/22/2013 0840   AST 23 12/12/2013 1055   AST 24 08/22/2013 0840   ALT 17 12/12/2013 1055   ALT 14 08/22/2013 0840   BILITOT 0.37 12/12/2013 1055   BILITOT 0.4 08/22/2013 0840       RADIOGRAPHIC STUDIES: I reviewed her skeletal survey I have personally reviewed the radiological images as listed and agreed with the findings in the report.  ASSESSMENT & PLAN:  #1 IgG lambda MGUS All tests did not showe evidence of end organ damage. I discussed with her the natural history of MGUS. I will see her back in 6 months with history, physical examination and blood work #2 chronic back pain with osteoporosis I recommend calcium with vitamin D supplements.  Orders Placed This Encounter  Procedures  . Kappa/lambda light chains    Standing Status: Future     Number of Occurrences:      Standing Expiration Date: 12/26/2014  . SPEP & IFE with QIG    Standing Status: Future     Number of Occurrences:      Standing Expiration Date: 12/26/2014  . CBC with Differential    Standing Status: Future     Number of Occurrences:      Standing Expiration Date: 12/26/2014  . Comprehensive metabolic panel    Standing Status: Future     Number of Occurrences:      Standing Expiration Date: 12/26/2014  . Lactate dehydrogenase    Standing Status: Future     Number of Occurrences:      Standing Expiration Date: 12/26/2014   All questions were answered. The patient knows to call the clinic with any problems, questions or concerns. No barriers to learning was detected. I spent 15 minutes counseling the patient face to face. The total time spent in the appointment was 20 minutes and more than 50% was on counseling and review of test results     Heath Lark, MD 12/26/2013 3:12 PM

## 2013-12-27 ENCOUNTER — Telehealth: Payer: Self-pay | Admitting: Hematology and Oncology

## 2013-12-27 NOTE — Telephone Encounter (Signed)
s.w.l pt and advised on OCT appt....pt ok adn aware °

## 2014-06-28 ENCOUNTER — Ambulatory Visit (HOSPITAL_BASED_OUTPATIENT_CLINIC_OR_DEPARTMENT_OTHER): Payer: Medicare Other | Admitting: Hematology and Oncology

## 2014-06-28 ENCOUNTER — Other Ambulatory Visit (HOSPITAL_BASED_OUTPATIENT_CLINIC_OR_DEPARTMENT_OTHER): Payer: Medicare Other

## 2014-06-28 ENCOUNTER — Telehealth: Payer: Self-pay | Admitting: Hematology and Oncology

## 2014-06-28 ENCOUNTER — Encounter: Payer: Self-pay | Admitting: Hematology and Oncology

## 2014-06-28 VITALS — BP 176/77 | HR 95 | Temp 98.2°F | Resp 17 | Ht 66.0 in | Wt 99.9 lb

## 2014-06-28 DIAGNOSIS — D472 Monoclonal gammopathy: Secondary | ICD-10-CM

## 2014-06-28 LAB — COMPREHENSIVE METABOLIC PANEL (CC13)
ALT: 15 U/L (ref 0–55)
ANION GAP: 5 meq/L (ref 3–11)
AST: 26 U/L (ref 5–34)
Albumin: 3.5 g/dL (ref 3.5–5.0)
Alkaline Phosphatase: 61 U/L (ref 40–150)
BILIRUBIN TOTAL: 0.44 mg/dL (ref 0.20–1.20)
BUN: 21.9 mg/dL (ref 7.0–26.0)
CO2: 30 meq/L — AB (ref 22–29)
Calcium: 9.7 mg/dL (ref 8.4–10.4)
Chloride: 102 mEq/L (ref 98–109)
Creatinine: 0.8 mg/dL (ref 0.6–1.1)
Glucose: 103 mg/dl (ref 70–140)
POTASSIUM: 4.5 meq/L (ref 3.5–5.1)
SODIUM: 137 meq/L (ref 136–145)
TOTAL PROTEIN: 7.3 g/dL (ref 6.4–8.3)

## 2014-06-28 LAB — CBC WITH DIFFERENTIAL/PLATELET
BASO%: 0.2 % (ref 0.0–2.0)
Basophils Absolute: 0 10*3/uL (ref 0.0–0.1)
EOS%: 1.8 % (ref 0.0–7.0)
Eosinophils Absolute: 0.1 10*3/uL (ref 0.0–0.5)
HCT: 36.1 % (ref 34.8–46.6)
HGB: 11.8 g/dL (ref 11.6–15.9)
LYMPH%: 34.5 % (ref 14.0–49.7)
MCH: 30.8 pg (ref 25.1–34.0)
MCHC: 32.7 g/dL (ref 31.5–36.0)
MCV: 94.3 fL (ref 79.5–101.0)
MONO#: 0.5 10*3/uL (ref 0.1–0.9)
MONO%: 9.7 % (ref 0.0–14.0)
NEUT#: 2.9 10*3/uL (ref 1.5–6.5)
NEUT%: 53.8 % (ref 38.4–76.8)
Platelets: 198 10*3/uL (ref 145–400)
RBC: 3.83 10*6/uL (ref 3.70–5.45)
RDW: 13.4 % (ref 11.2–14.5)
WBC: 5.4 10*3/uL (ref 3.9–10.3)
lymph#: 1.9 10*3/uL (ref 0.9–3.3)

## 2014-06-28 LAB — LACTATE DEHYDROGENASE (CC13): LDH: 210 U/L (ref 125–245)

## 2014-06-28 NOTE — Telephone Encounter (Signed)
gv pt appt schedule for oct 2016.

## 2014-06-28 NOTE — Progress Notes (Signed)
[place] OFFICE PROGRESS NOTE  Patient Care Team: Lorene Dy, MD as PCP - General (Internal Medicine)  SUMMARY OF ONCOLOGIC HISTORY: This patient was diagnosed of MGUS detected on routine blood work in 2015. The patient recently had severe dehydration requiring hospitalization. She was also mildly anemic. She had history of severe back pain resolved recently. 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 She was being observed.  INTERVAL HISTORY: Please see below for problem oriented charting. She denies recent bone pain. No recent infection.  REVIEW OF SYSTEMS:   Constitutional: Denies fevers, chills or abnormal weight loss Eyes: Denies blurriness of vision Ears, nose, mouth, throat, and face: Denies mucositis or sore throat Respiratory: Denies cough, dyspnea or wheezes Cardiovascular: Denies palpitation, chest discomfort or lower extremity swelling Gastrointestinal:  Denies nausea, heartburn or change in bowel habits Skin: Denies abnormal skin rashes Lymphatics: Denies new lymphadenopathy or easy bruising Neurological:Denies numbness, tingling or new weaknesses Behavioral/Psych: Mood is stable, no new changes  All other systems were reviewed with the patient and are negative.  I have reviewed the past medical history, past surgical history, social history and family history with the patient and they are unchanged from previous note.  ALLERGIES:  has No Known Allergies.  MEDICATIONS:  Current Outpatient Prescriptions  Medication Sig Dispense Refill  . acetaminophen (TYLENOL) 500 MG tablet Take 500 mg by mouth every 8 (eight) hours as needed for mild pain.      Marland Kitchen b complex vitamins tablet Take 1 tablet by mouth daily. Super B complex Nature [employer]      . Calcium-Magnesium-Vitamin D (CALCIUM 500 PO) Take 500 mg by mouth daily.      . carboxymethylcellulose (REFRESH TEARS) 0.5 % SOLN Place 1 drop into both eyes at bedtime.      Marland Kitchen  levothyroxine (SYNTHROID, LEVOTHROID) 125 MCG tablet Take 125 mcg by mouth daily before breakfast.      . Multiple Vitamins-Calcium (ONE-A-DAY WOMENS PO) Take 1 tablet by mouth daily.      . polyethylene glycol (MIRALAX / GLYCOLAX) packet Take 17 g by mouth daily as needed for mild constipation.      . simvastatin (ZOCOR) 20 MG tablet Take 20 mg by mouth daily.       No current facility-administered medications for this visit.    PHYSICAL EXAMINATION: ECOG PERFORMANCE STATUS: 0 - Asymptomatic  Filed Vitals:   06/28/14 1151  BP: 176/77  Pulse: 95  Temp: 98.2 F (36.8 C)  Resp: 17   Filed Weights   06/28/14 1151  Weight: 99 lb 14.4 oz (45.314 kg)    GENERAL:alert, no distress and comfortable. She looks thin and cachectic SKIN: skin color, texture, turgor are normal, no rashes or significant lesions EYES: normal, Conjunctiva are pink and non-injected, sclera clear OROPHARYNX:no exudate, no erythema and lips, buccal mucosa, and tongue normal  NECK: supple, thyroid normal size, non-tender, without nodularity LYMPH:  no palpable lymphadenopathy in the cervical, axillary or inguinal LUNGS: clear to auscultation and percussion with normal breathing effort HEART: regular rate & rhythm and no murmurs and no lower extremity edema ABDOMEN:abdomen soft, non-tender and normal bowel sounds Musculoskeletal:no cyanosis of digits and no clubbing  NEURO: alert & oriented x 3 with fluent speech, no focal motor/sensory deficits  LABORATORY DATA:  I have reviewed the data as listed    Component Value Date/Time   NA 138 12/12/2013 1055   NA 133* 08/23/2013 0359   K 4.1 12/12/2013 1055  K 3.9 08/23/2013 0359   CL 101 08/23/2013 0359   CO2 29 12/12/2013 1055   CO2 25 08/23/2013 0359   GLUCOSE 95 12/12/2013 1055   GLUCOSE 93 08/23/2013 0359   BUN 28.1* 12/12/2013 1055   BUN 12 08/23/2013 0359   CREATININE 0.9 12/12/2013 1055   CREATININE 0.75 08/23/2013 0359   CALCIUM 9.8 12/12/2013 1055   CALCIUM  8.2* 08/23/2013 0359   PROT 7.8 12/12/2013 1055   PROT 7.3 08/22/2013 0840   ALBUMIN 3.8 12/12/2013 1055   ALBUMIN 2.9* 08/22/2013 0840   AST 23 12/12/2013 1055   AST 24 08/22/2013 0840   ALT 17 12/12/2013 1055   ALT 14 08/22/2013 0840   ALKPHOS 72 12/12/2013 1055   ALKPHOS 60 08/22/2013 0840   BILITOT 0.37 12/12/2013 1055   BILITOT 0.4 08/22/2013 0840   GFRNONAA 72* 08/23/2013 0359   GFRAA 83* 08/23/2013 0359    No results found for this basename: SPEP,  UPEP,   kappa and lambda light chains    Lab Results  Component Value Date   WBC 5.4 06/28/2014   NEUTROABS 2.9 06/28/2014   HGB 11.8 06/28/2014   HCT 36.1 06/28/2014   MCV 94.3 06/28/2014   PLT 198 06/28/2014      Chemistry      Component Value Date/Time   NA 138 12/12/2013 1055   NA 133* 08/23/2013 0359   K 4.1 12/12/2013 1055   K 3.9 08/23/2013 0359   CL 101 08/23/2013 0359   CO2 29 12/12/2013 1055   CO2 25 08/23/2013 0359   BUN 28.1* 12/12/2013 1055   BUN 12 08/23/2013 0359   CREATININE 0.9 12/12/2013 1055   CREATININE 0.75 08/23/2013 0359      Component Value Date/Time   CALCIUM 9.8 12/12/2013 1055   CALCIUM 8.2* 08/23/2013 0359   ALKPHOS 72 12/12/2013 1055   ALKPHOS 60 08/22/2013 0840   AST 23 12/12/2013 1055   AST 24 08/22/2013 0840   ALT 17 12/12/2013 1055   ALT 14 08/22/2013 0840   BILITOT 0.37 12/12/2013 1055   BILITOT 0.4 08/22/2013 0840     ASSESSMENT & PLAN:  MGUS (monoclonal gammopathy of unknown significance) Clinically, she has no signs of progression. Labs are pending. Next year, I plan to see her back with history, physical examination and blood work a week of of time.    Orders Placed This Encounter  Procedures  . CBC with Differential    Standing Status: Future     Number of Occurrences:      Standing Expiration Date: 08/02/2015  . Comprehensive metabolic panel    Standing Status: Future     Number of Occurrences:      Standing Expiration Date: 08/02/2015  . SPEP & IFE with QIG    Standing Status: Future      Number of Occurrences:      Standing Expiration Date: 08/02/2015  . Kappa/lambda light chains    Standing Status: Future     Number of Occurrences:      Standing Expiration Date: 08/02/2015  . Beta 2 microglobulin, serum    Standing Status: Future     Number of Occurrences:      Standing Expiration Date: 08/02/2015   All questions were answered. The patient knows to call the clinic with any problems, questions or concerns. No barriers to learning was detected. I spent 15 minutes counseling the patient face to face. The total time spent in the appointment was 20 minutes and more  than 50% was on counseling and review of test results     [facility], Emilo Gras, MD 06/28/2014 12:05 PM

## 2014-06-28 NOTE — Assessment & Plan Note (Signed)
Clinically, she has no signs of progression. Labs are pending. Next year, I plan to see her back with history, physical examination and blood work a week of of time.

## 2014-07-02 LAB — SPEP & IFE WITH QIG
Albumin ELP: 54.2 % — ABNORMAL LOW (ref 55.8–66.1)
Alpha-1-Globulin: 5 % — ABNORMAL HIGH (ref 2.9–4.9)
Alpha-2-Globulin: 11.1 % (ref 7.1–11.8)
Beta 2: 3.5 % (ref 3.2–6.5)
Beta Globulin: 5.4 % (ref 4.7–7.2)
GAMMA GLOBULIN: 20.8 % — AB (ref 11.1–18.8)
IGM, SERUM: 46 mg/dL — AB (ref 52–322)
IgA: 115 mg/dL (ref 69–380)
IgG (Immunoglobin G), Serum: 1480 mg/dL (ref 690–1700)
M-Spike, %: 1.09 g/dL
TOTAL PROTEIN, SERUM ELECTROPHOR: 6.8 g/dL (ref 6.0–8.3)

## 2014-07-02 LAB — KAPPA/LAMBDA LIGHT CHAINS
KAPPA LAMBDA RATIO: 0.15 — AB (ref 0.26–1.65)
Kappa free light chain: 2.59 mg/dL — ABNORMAL HIGH (ref 0.33–1.94)
Lambda Free Lght Chn: 17.6 mg/dL — ABNORMAL HIGH (ref 0.57–2.63)

## 2015-01-16 IMAGING — CR DG CHEST 1V PORT
1 series · 1 of 1 positions shown · non-contrast
Comparison: None

CLINICAL DATA: Unexplained fatigue.  Evaluate for mass.

EXAM:
PORTABLE CHEST - 1 VIEW

[AP]
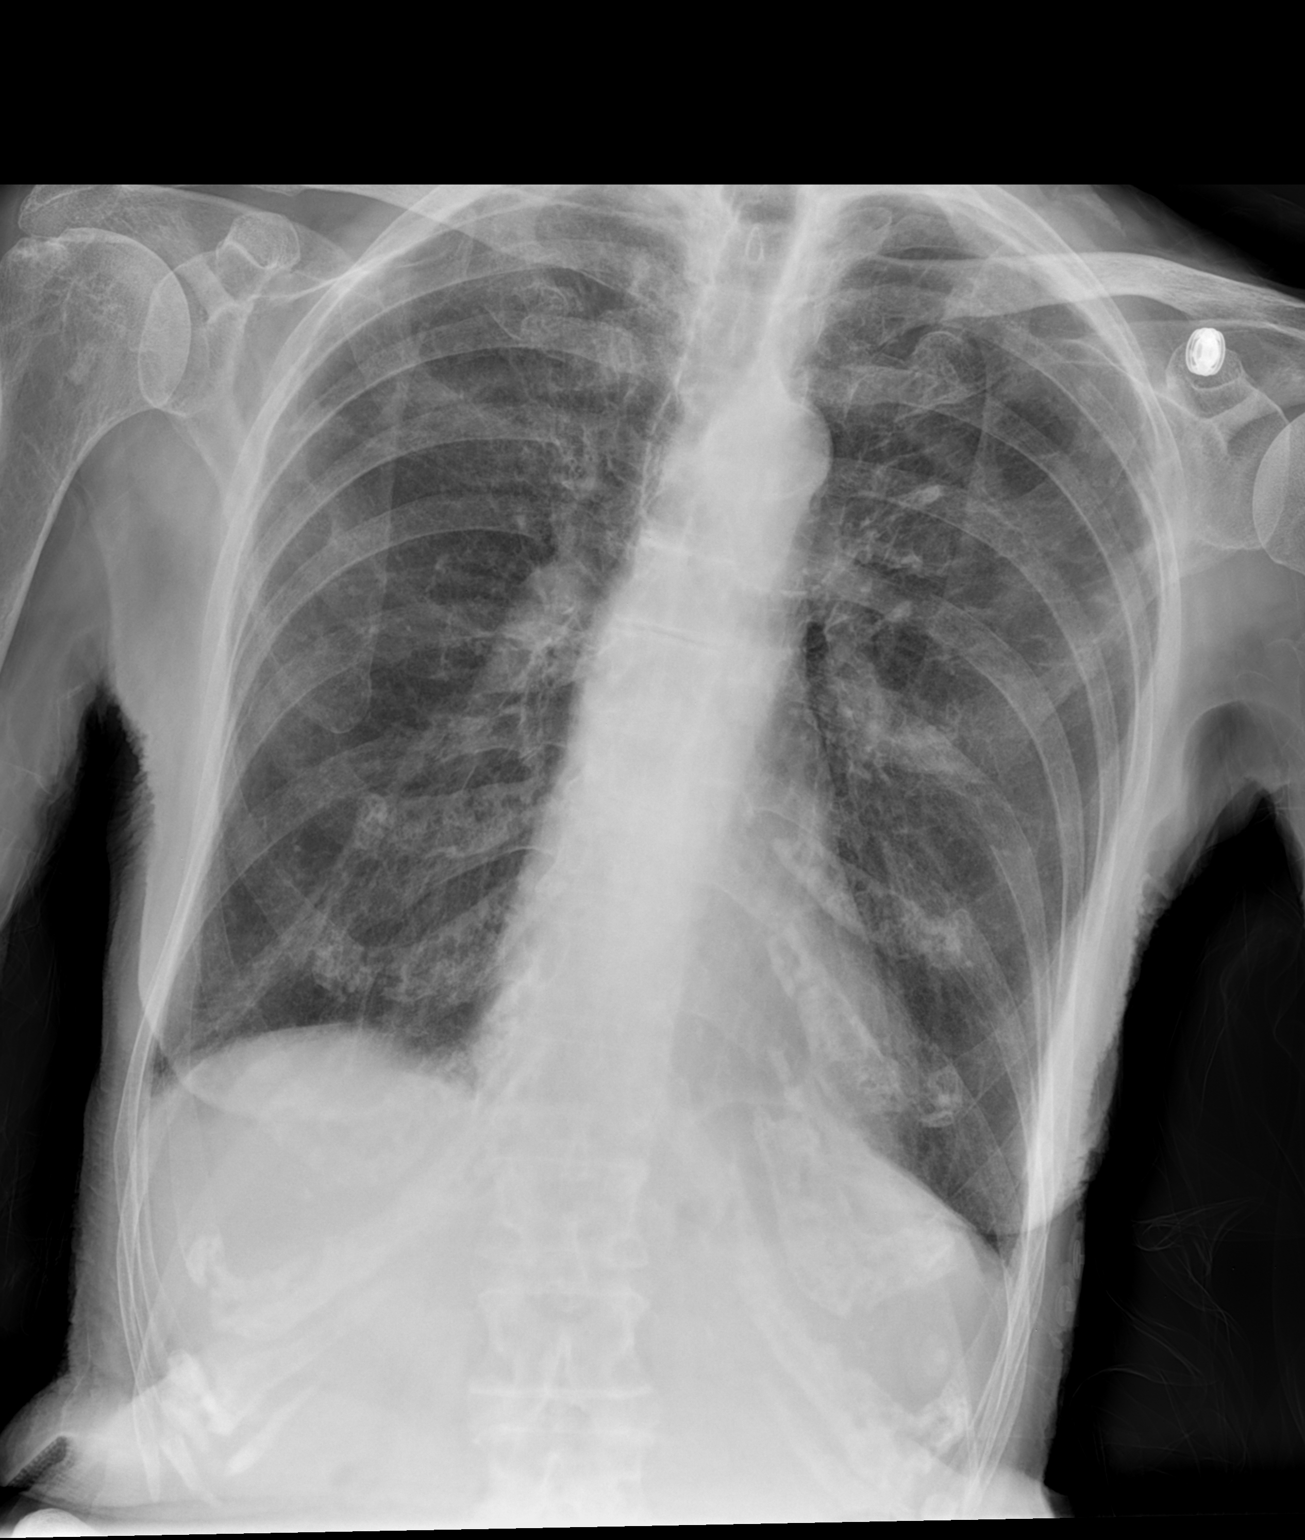

[1 of 1 positions shown; findings below may reference images not displayed]

FINDINGS: Normal heart size.

No pleural effusion or edema identified. Lungs are hyperinflated and
there are coarsened interstitial markings noted bilaterally. No
airspace consolidation identified.

The visualized bony structures appear intact.
IMPRESSION: 1. No acute findings.
2. Increased lung volumes and coarsened interstitial markings
suggest COPD.

## 2015-06-24 ENCOUNTER — Other Ambulatory Visit (HOSPITAL_BASED_OUTPATIENT_CLINIC_OR_DEPARTMENT_OTHER): Payer: Medicare Other

## 2015-06-24 DIAGNOSIS — D472 Monoclonal gammopathy: Secondary | ICD-10-CM

## 2015-06-24 LAB — COMPREHENSIVE METABOLIC PANEL (CC13)
ALBUMIN: 3.5 g/dL (ref 3.5–5.0)
ALK PHOS: 57 U/L (ref 40–150)
ALT: 14 U/L (ref 0–55)
AST: 24 U/L (ref 5–34)
Anion Gap: 2 mEq/L — ABNORMAL LOW (ref 3–11)
BUN: 25.1 mg/dL (ref 7.0–26.0)
CALCIUM: 9.4 mg/dL (ref 8.4–10.4)
CO2: 30 mEq/L — ABNORMAL HIGH (ref 22–29)
CREATININE: 0.9 mg/dL (ref 0.6–1.1)
Chloride: 105 mEq/L (ref 98–109)
EGFR: 57 mL/min/{1.73_m2} — ABNORMAL LOW (ref >90)
GLUCOSE: 106 mg/dL (ref 70–140)
Potassium: 4.4 mEq/L (ref 3.5–5.1)
SODIUM: 137 meq/L (ref 136–145)
Total Bilirubin: 0.39 mg/dL (ref 0.20–1.20)
Total Protein: 7 g/dL (ref 6.4–8.3)

## 2015-06-24 LAB — CBC WITH DIFFERENTIAL/PLATELET
BASO%: 0.2 % (ref 0.0–2.0)
Basophils Absolute: 0 10*3/uL (ref 0.0–0.1)
EOS%: 3 % (ref 0.0–7.0)
Eosinophils Absolute: 0.2 10*3/uL (ref 0.0–0.5)
HEMATOCRIT: 36 % (ref 34.8–46.6)
HEMOGLOBIN: 11.9 g/dL (ref 11.6–15.9)
LYMPH#: 1.6 10*3/uL (ref 0.9–3.3)
LYMPH%: 28.6 % (ref 14.0–49.7)
MCH: 31.5 pg (ref 25.1–34.0)
MCHC: 33.1 g/dL (ref 31.5–36.0)
MCV: 95.2 fL (ref 79.5–101.0)
MONO#: 0.5 10*3/uL (ref 0.1–0.9)
MONO%: 9.1 % (ref 0.0–14.0)
NEUT%: 59.1 % (ref 38.4–76.8)
NEUTROS ABS: 3.4 10*3/uL (ref 1.5–6.5)
NRBC: 0 % (ref 0–0)
Platelets: 177 10*3/uL (ref 145–400)
RBC: 3.78 10*6/uL (ref 3.70–5.45)
RDW: 13.9 % (ref 11.2–14.5)
WBC: 5.7 10*3/uL (ref 3.9–10.3)

## 2015-06-26 LAB — SPEP & IFE WITH QIG
Abnormal Protein Band1: 1 g/dL
Albumin ELP: 3.8 g/dL (ref 3.8–4.8)
Alpha-1-Globulin: 0.3 g/dL (ref 0.2–0.3)
Alpha-2-Globulin: 0.8 g/dL (ref 0.5–0.9)
BETA GLOBULIN: 0.4 g/dL (ref 0.4–0.6)
Beta 2: 0.2 g/dL (ref 0.2–0.5)
Gamma Globulin: 1.4 g/dL (ref 0.8–1.7)
IGG (IMMUNOGLOBIN G), SERUM: 1620 mg/dL (ref 690–1700)
IgA: 117 mg/dL (ref 69–380)
IgM, Serum: 37 mg/dL — ABNORMAL LOW (ref 52–322)
Total Protein, Serum Electrophoresis: 6.9 g/dL (ref 6.1–8.1)

## 2015-06-26 LAB — KAPPA/LAMBDA LIGHT CHAINS
Kappa free light chain: 3.01 mg/dL — ABNORMAL HIGH (ref 0.33–1.94)
Kappa:Lambda Ratio: 0.16 — ABNORMAL LOW (ref 0.26–1.65)
Lambda Free Lght Chn: 19 mg/dL — ABNORMAL HIGH (ref 0.57–2.63)

## 2015-07-01 ENCOUNTER — Telehealth: Payer: Self-pay | Admitting: Hematology and Oncology

## 2015-07-01 ENCOUNTER — Encounter: Payer: Self-pay | Admitting: Hematology and Oncology

## 2015-07-01 ENCOUNTER — Ambulatory Visit (HOSPITAL_BASED_OUTPATIENT_CLINIC_OR_DEPARTMENT_OTHER): Payer: Medicare Other | Admitting: Hematology and Oncology

## 2015-07-01 VITALS — BP 160/67 | HR 72 | Temp 97.6°F | Resp 20 | Ht 66.0 in | Wt 103.0 lb

## 2015-07-01 DIAGNOSIS — I10 Essential (primary) hypertension: Secondary | ICD-10-CM | POA: Diagnosis not present

## 2015-07-01 DIAGNOSIS — D472 Monoclonal gammopathy: Secondary | ICD-10-CM

## 2015-07-01 DIAGNOSIS — N183 Chronic kidney disease, stage 3 unspecified: Secondary | ICD-10-CM

## 2015-07-01 NOTE — Assessment & Plan Note (Signed)
This is related to chronic kidney disease. She is not symptomatic. Recommend observation only.

## 2015-07-01 NOTE — Assessment & Plan Note (Signed)
Clinically, she has no signs of progression. Labs are stable with no disease progression Next year, I plan to see her back with history, physical examination and blood work a week of of time. 

## 2015-07-01 NOTE — Progress Notes (Signed)
[place] OFFICE PROGRESS NOTE  Patient Care Team: Lorene Dy, MD as PCP - General (Internal Medicine)  SUMMARY OF ONCOLOGIC HISTORY:  This patient was diagnosed of MGUS detected on routine blood work in 2015. The patient recently had severe dehydration requiring hospitalization. She was also mildly anemic. She had history of severe back pain resolved recently. 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 She was being observed.  INTERVAL HISTORY: Please see below for problem oriented charting. She feels well. Denies recent infection. No bone pain or recent fractures.  REVIEW OF SYSTEMS:   Constitutional: Denies fevers, chills or abnormal weight loss Eyes: Denies blurriness of vision Ears, nose, mouth, throat, and face: Denies mucositis or sore throat Respiratory: Denies cough, dyspnea or wheezes Cardiovascular: Denies palpitation, chest discomfort or lower extremity swelling Gastrointestinal:  Denies nausea, heartburn or change in bowel habits Skin: Denies abnormal skin rashes Lymphatics: Denies new lymphadenopathy or easy bruising Neurological:Denies numbness, tingling or new weaknesses Behavioral/Psych: Mood is stable, no new changes  All other systems were reviewed with the patient and are negative.  I have reviewed the past medical history, past surgical history, social history and family history with the patient and they are unchanged from previous note.  ALLERGIES:  has No Known Allergies.  MEDICATIONS:  Current Outpatient Prescriptions  Medication Sig Dispense Refill  . acetaminophen (TYLENOL) 500 MG tablet Take 500 mg by mouth every 8 (eight) hours as needed for mild pain.    Marland Kitchen b complex vitamins tablet Take 1 tablet by mouth daily. Super B complex Nature Made    . Calcium-Magnesium-Vitamin D (CALCIUM 500 PO) Take 500 mg by mouth daily.    . carboxymethylcellulose (REFRESH TEARS) 0.5 % SOLN Place 1 drop into both eyes at  bedtime.    Marland Kitchen levothyroxine (SYNTHROID, LEVOTHROID) 112 MCG tablet Take 112 mcg by mouth daily.  3  . metoprolol (LOPRESSOR) 50 MG tablet Take 50 mg by mouth 2 (two) times daily.  4  . Multiple Vitamins-Calcium (ONE-A-DAY WOMENS PO) Take 1 tablet by mouth daily.    . polyethylene glycol (MIRALAX / GLYCOLAX) packet Take 17 g by mouth daily as needed for mild constipation.    . simvastatin (ZOCOR) 20 MG tablet Take 20 mg by mouth daily.     No current facility-administered medications for this visit.    PHYSICAL EXAMINATION: ECOG PERFORMANCE STATUS: 0 - Asymptomatic  Filed Vitals:   07/01/15 1043  BP: 160/67  Pulse: 72  Temp: 97.6 F (36.4 C)  Resp: 20   Filed Weights   07/01/15 1043  Weight: 103 lb (46.72 kg)    GENERAL:alert, no distress and comfortable. She looks thin and cachectic SKIN: skin color, texture, turgor are normal, no rashes or significant lesions EYES: normal, Conjunctiva are pink and non-injected, sclera clear OROPHARYNX:no exudate, no erythema and lips, buccal mucosa, and tongue normal  NECK: supple, thyroid normal size, non-tender, without nodularity LYMPH:  no palpable lymphadenopathy in the cervical, axillary or inguinal LUNGS: clear to auscultation and percussion with normal breathing effort HEART: regular rate & rhythm and no murmurs and no lower extremity edema ABDOMEN:abdomen soft, non-tender and normal bowel sounds Musculoskeletal:no cyanosis of digits and no clubbing  NEURO: alert & oriented x 3 with fluent speech, no focal motor/sensory deficits  LABORATORY DATA:  I have reviewed the data as listed    Component Value Date/Time   NA 137 06/24/2015 1015   NA 133* 08/23/2013 0359   K 4.4 06/24/2015  1015   K 3.9 08/23/2013 0359   CL 101 08/23/2013 0359   CO2 30* 06/24/2015 1015   CO2 25 08/23/2013 0359   GLUCOSE 106 06/24/2015 1015   GLUCOSE 93 08/23/2013 0359   BUN 25.1 06/24/2015 1015   BUN 12 08/23/2013 0359   CREATININE 0.9 06/24/2015  1015   CREATININE 0.75 08/23/2013 0359   CALCIUM 9.4 06/24/2015 1015   CALCIUM 8.2* 08/23/2013 0359   PROT 7.0 06/24/2015 1015   PROT 7.3 08/22/2013 0840   ALBUMIN 3.5 06/24/2015 1015   ALBUMIN 2.9* 08/22/2013 0840   AST 24 06/24/2015 1015   AST 24 08/22/2013 0840   ALT 14 06/24/2015 1015   ALT 14 08/22/2013 0840   ALKPHOS 57 06/24/2015 1015   ALKPHOS 60 08/22/2013 0840   BILITOT 0.39 06/24/2015 1015   BILITOT 0.4 08/22/2013 0840   GFRNONAA 72* 08/23/2013 0359   GFRAA 83* 08/23/2013 0359    No results found for: SPEP, UPEP  Lab Results  Component Value Date   WBC 5.7 06/24/2015   NEUTROABS 3.4 06/24/2015   HGB 11.9 06/24/2015   HCT 36.0 06/24/2015   MCV 95.2 06/24/2015   PLT 177 06/24/2015      Chemistry      Component Value Date/Time   NA 137 06/24/2015 1015   NA 133* 08/23/2013 0359   K 4.4 06/24/2015 1015   K 3.9 08/23/2013 0359   CL 101 08/23/2013 0359   CO2 30* 06/24/2015 1015   CO2 25 08/23/2013 0359   BUN 25.1 06/24/2015 1015   BUN 12 08/23/2013 0359   CREATININE 0.9 06/24/2015 1015   CREATININE 0.75 08/23/2013 0359      Component Value Date/Time   CALCIUM 9.4 06/24/2015 1015   CALCIUM 8.2* 08/23/2013 0359   ALKPHOS 57 06/24/2015 1015   ALKPHOS 60 08/22/2013 0840   AST 24 06/24/2015 1015   AST 24 08/22/2013 0840   ALT 14 06/24/2015 1015   ALT 14 08/22/2013 0840   BILITOT 0.39 06/24/2015 1015   BILITOT 0.4 08/22/2013 0840     ASSESSMENT & PLAN:  MGUS (monoclonal gammopathy of unknown significance) Clinically, she has no signs of progression. Labs are stable with no disease progression Next year, I plan to see her back with history, physical examination and blood work a week of of time.    Essential hypertension The patient felt that this could be related to white coat hypertension. .she will continue current medical management. I recommend close follow-up with primary care doctor for medication adjustment.   CKD (chronic kidney disease)  stage 3, GFR 30-59 ml/min This is related to chronic kidney disease. She is not symptomatic. Recommend observation only.   Orders Placed This Encounter  Procedures  . Comprehensive metabolic panel    Standing Status: Future     Number of Occurrences:      Standing Expiration Date: 08/04/2016  . CBC with Differential/Platelet    Standing Status: Future     Number of Occurrences:      Standing Expiration Date: 08/04/2016  . SPEP & IFE with QIG    Standing Status: Future     Number of Occurrences:      Standing Expiration Date: 08/04/2016  . Kappa/lambda light chains    Standing Status: Future     Number of Occurrences:      Standing Expiration Date: 08/04/2016   All questions were answered. The patient knows to call the clinic with any problems, questions or concerns. No barriers to  learning was detected. I spent 15 minutes counseling the patient face to face. The total time spent in the appointment was 20 minutes and more than 50% was on counseling and review of test results     [facility], [place], MD 07/01/2015 1:58 PM

## 2015-07-01 NOTE — Assessment & Plan Note (Signed)
The patient felt that this could be related to white coat hypertension. .she will continue current medical management. I recommend close follow-up with primary care doctor for medication adjustment.

## 2015-07-01 NOTE — Telephone Encounter (Signed)
Gave and printed appt sched and avs fo rpt for OCT 2017 °

## 2015-10-07 ENCOUNTER — Other Ambulatory Visit: Payer: Self-pay | Admitting: Internal Medicine

## 2015-10-07 ENCOUNTER — Ambulatory Visit
Admission: RE | Admit: 2015-10-07 | Discharge: 2015-10-07 | Disposition: A | Discharge status: Home / Self Care | Payer: Medicare Other | Source: Ambulatory Visit | Attending: Internal Medicine | Admitting: Internal Medicine

## 2015-10-07 DIAGNOSIS — M25552 Pain in left hip: Secondary | ICD-10-CM

## 2016-02-24 ENCOUNTER — Encounter (HOSPITAL_COMMUNITY): Payer: Self-pay | Admitting: *Deleted

## 2016-02-24 ENCOUNTER — Emergency Department (HOSPITAL_COMMUNITY)
Admission: EM | Admit: 2016-02-24 | Discharge: 2016-02-24 | Disposition: A | Discharge status: Home / Self Care | Payer: Medicare Other | Attending: Emergency Medicine | Admitting: Emergency Medicine

## 2016-02-24 DIAGNOSIS — M1990 Unspecified osteoarthritis, unspecified site: Secondary | ICD-10-CM | POA: Insufficient documentation

## 2016-02-24 DIAGNOSIS — E785 Hyperlipidemia, unspecified: Secondary | ICD-10-CM | POA: Insufficient documentation

## 2016-02-24 DIAGNOSIS — Y829 Unspecified medical devices associated with adverse incidents: Secondary | ICD-10-CM | POA: Diagnosis not present

## 2016-02-24 DIAGNOSIS — T887XXA Unspecified adverse effect of drug or medicament, initial encounter: Secondary | ICD-10-CM | POA: Insufficient documentation

## 2016-02-24 DIAGNOSIS — T428X5A Adverse effect of antiparkinsonism drugs and other central muscle-tone depressants, initial encounter: Secondary | ICD-10-CM | POA: Insufficient documentation

## 2016-02-24 DIAGNOSIS — T50905A Adverse effect of unspecified drugs, medicaments and biological substances, initial encounter: Secondary | ICD-10-CM

## 2016-02-24 DIAGNOSIS — R4182 Altered mental status, unspecified: Secondary | ICD-10-CM | POA: Diagnosis present

## 2016-02-24 DIAGNOSIS — E119 Type 2 diabetes mellitus without complications: Secondary | ICD-10-CM | POA: Insufficient documentation

## 2016-02-24 DIAGNOSIS — I10 Essential (primary) hypertension: Secondary | ICD-10-CM | POA: Insufficient documentation

## 2016-02-24 LAB — CBC WITH DIFFERENTIAL/PLATELET
Basophils Absolute: 0 10*3/uL (ref 0.0–0.1)
Basophils Relative: 0 %
EOS PCT: 1 %
Eosinophils Absolute: 0.1 10*3/uL (ref 0.0–0.7)
HCT: 30.8 % — ABNORMAL LOW (ref 36.0–46.0)
Hemoglobin: 10.2 g/dL — ABNORMAL LOW (ref 12.0–15.0)
Lymphocytes Relative: 20 %
Lymphs Abs: 1.4 10*3/uL (ref 0.7–4.0)
MCH: 30.5 pg (ref 26.0–34.0)
MCHC: 33.1 g/dL (ref 30.0–36.0)
MCV: 92.2 fL (ref 78.0–100.0)
MONO ABS: 0.5 10*3/uL (ref 0.1–1.0)
MONOS PCT: 7 %
Neutro Abs: 5.3 10*3/uL (ref 1.7–7.7)
Neutrophils Relative %: 72 %
PLATELETS: 197 10*3/uL (ref 150–400)
RBC: 3.34 MIL/uL — ABNORMAL LOW (ref 3.87–5.11)
RDW: 14.5 % (ref 11.5–15.5)
WBC: 7.3 10*3/uL (ref 4.0–10.5)

## 2016-02-24 LAB — I-STAT CHEM 8, ED
BUN: 35 mg/dL — ABNORMAL HIGH (ref 6–20)
CALCIUM ION: 1.18 mmol/L (ref 1.13–1.30)
Chloride: 102 mmol/L (ref 101–111)
Creatinine, Ser: 0.9 mg/dL (ref 0.44–1.00)
GLUCOSE: 134 mg/dL — AB (ref 65–99)
HCT: 31 % — ABNORMAL LOW (ref 36.0–46.0)
HEMOGLOBIN: 10.5 g/dL — AB (ref 12.0–15.0)
Potassium: 5.5 mmol/L — ABNORMAL HIGH (ref 3.5–5.1)
Sodium: 137 mmol/L (ref 135–145)
TCO2: 30 mmol/L (ref 0–100)

## 2016-02-24 LAB — RAPID URINE DRUG SCREEN, HOSP PERFORMED
Amphetamines: NOT DETECTED
BARBITURATES: NOT DETECTED
BENZODIAZEPINES: NOT DETECTED
COCAINE: NOT DETECTED
OPIATES: NOT DETECTED
Tetrahydrocannabinol: NOT DETECTED

## 2016-02-24 MED ORDER — SODIUM CHLORIDE 0.9 % IV BOLUS (SEPSIS)
250.0000 mL | Freq: Once | INTRAVENOUS | Status: AC
  Administered 2016-02-24: 250 mL via INTRAVENOUS

## 2016-02-24 MED ORDER — SODIUM CHLORIDE 0.9 % IV SOLN
Freq: Once | INTRAVENOUS | Status: AC
  Administered 2016-02-24: 20:00:00 via INTRAVENOUS

## 2016-02-24 NOTE — ED Provider Notes (Signed)
CSN: [id number]     Arrival date & time 02/24/16  1900 History   First MD Initiated Contact with Patient 02/24/16 2003     Chief Complaint  Patient presents with  . Altered Mental Status     (Consider location/radiation/quality/duration/timing/severity/associated sxs/prior Treatment) HPI Comments: This is a 82 year old female with a history of arthritis who was seen by her primary care physician due to increased neck pain who prescribed Zanaflex 4 mg.  Patient took one dose and approximate 60 minutes after consumption was found to be unresponsive with pinpoint pupils.  EMS responded.  They did give the patient Narcan and she came back to her normal baseline.  Patient is a 82 y.o. female presenting with altered mental status. The history is provided by the patient and the EMS personnel.  Altered Mental Status Presenting symptoms: unresponsiveness   Severity:  Severe Most recent episode:  Today Episode history:  Single Timing:  Unable to specify Progression:  Improving Chronicity:  New Context: recent change in medication   Associated symptoms: no fever and no headaches     Past Medical History  Diagnosis Date  . Hypothyroidism   . Hypertension   . Hyperlipidemia   . Diabetes mellitus without complication ([place])   . Anemia   . Arthritis   . Diverticulosis   . Constipation   . MGUS (monoclonal gammopathy of unknown significance) 12/10/2013  . Unspecified deficiency anemia 12/10/2013   Past Surgical History  Procedure Laterality Date  . Tonsillectomy    . Abdominal hysterectomy    . Cataract extraction, bilateral    . Esophagogastroduodenoscopy endoscopy  05/13/1998   Family History  Problem Relation Age of Onset  . Colon cancer Mother   . Heart disease Father   . Thyroid disease Daughter   . Thyroid disease Daughter    Social History  Substance Use Topics  . Smoking status: Never Smoker   . Smokeless tobacco: Never Used  . Alcohol Use: No   OB History    No data  available     Review of Systems  Constitutional: Negative for fever and chills.  Respiratory: Negative for shortness of breath.   Cardiovascular: Negative for chest pain.  Musculoskeletal: Positive for arthralgias.  Neurological: Negative for dizziness and headaches.  All other systems reviewed and are negative.     Allergies  Review of patient's allergies indicates no known allergies.  Home Medications   Prior to Admission medications   Medication Sig Start Date End Date Taking? Authorizing Provider  acetaminophen (TYLENOL) 500 MG tablet Take 500 mg by mouth every 8 (eight) hours as needed for mild pain.   Yes Historical Provider, MD  b complex vitamins tablet Take 1 tablet by mouth daily. Super B complex Nature Made   Yes Historical Provider, MD  Calcium-Magnesium-Vitamin D (CALCIUM 500 PO) Take 500 mg by mouth daily.   Yes Historical Provider, MD  carboxymethylcellulose (REFRESH TEARS) 0.5 % SOLN Place 1 drop into both eyes at bedtime.   Yes Historical Provider, MD  levothyroxine (SYNTHROID, LEVOTHROID) 112 MCG tablet Take 112 mcg by mouth daily. 04/06/15  Yes Historical Provider, MD  metoprolol (LOPRESSOR) 50 MG tablet Take 50 mg by mouth 2 (two) times daily. 05/21/15  Yes Historical Provider, MD  Multiple Vitamins-Calcium (ONE-A-DAY WOMENS PO) Take 1 tablet by mouth daily.   Yes Historical Provider, MD  Multiple Vitamins-Minerals (ICAPS AREDS 2 PO) Take 1 tablet by mouth 2 (two) times daily.   Yes Historical Provider, MD  polyethylene glycol (  MIRALAX / GLYCOLAX) packet Take 17 g by mouth daily as needed for mild constipation.   Yes Historical Provider, MD  simvastatin (ZOCOR) 20 MG tablet Take 20 mg by mouth daily.   Yes Historical Provider, MD  tiZANidine (ZANAFLEX) 4 MG tablet Take 4 mg by mouth every 6 (six) hours as needed for muscle spasms.  02/24/16  Yes Historical Provider, MD   BP 146/86 mmHg  Pulse 75  Temp(Src) 97.5 F (36.4 C) (Oral)  Resp 17  Ht 5\' 6"  (1.676 m)  Wt  48.081 kg  BMI 17.12 kg/m2  SpO2 97% Physical Exam  Constitutional: She is oriented to person, place, and time. She appears well-developed and well-nourished.  HENT:  Head: Normocephalic.  Eyes: Pupils are equal, round, and reactive to light.  Neck: Normal range of motion.  Cardiovascular: Normal rate and regular rhythm.   Pulmonary/Chest: Effort normal. No respiratory distress. She has no wheezes.  Musculoskeletal: Normal range of motion.  Neurological: She is alert and oriented to person, place, and time.  Skin: Skin is warm and dry.  Nursing note and vitals reviewed.   ED Course  Procedures (including critical care time) Labs Review Labs Reviewed  CBC WITH DIFFERENTIAL/PLATELET - Abnormal; Notable for the following:    RBC 3.34 (*)    Hemoglobin 10.2 (*)    HCT 30.8 (*)    All other components within normal limits  I-STAT CHEM 8, ED - Abnormal; Notable for the following:    Potassium 5.5 (*)    BUN 35 (*)    Glucose, Bld 134 (*)    Hemoglobin 10.5 (*)    HCT 31.0 (*)    All other components within normal limits  URINE RAPID DRUG SCREEN, HOSP PERFORMED    Imaging Review No results found. I have personally reviewed and evaluated these images and lab results as part of my medical decision-making.   EKG Interpretation None    Patient is back to her baseline.  She states that her arthritic pain is the same.  She's not having any difficulty breathing at this time.  She is aware of what happened and she states she will no longer take any Zanaflex Vernard Gambles has been observed.  Her pressure has normalized.  After gentle hydration.  She's been alert and oriented she's been up ambulating to the restroom without difficulty. MDM   Final diagnoses:  Medication reaction, initial encounter       Junius Creamer, NP 02/24/16 [place], MD 02/24/16 [phone]

## 2016-02-24 NOTE — ED Notes (Signed)
New medication taken prior to unresponsiveness was Tizanidine.

## 2016-02-24 NOTE — ED Notes (Signed)
Per [facility], pt was unresponsive to everything including painful stimuli, started a new pain med today & approx 1 hour after taking med became unresponsive, had pinpoint pupils.  Spouse stated she was apneic.  Pt received Narcan 0.4 mg IV.

## 2016-02-24 NOTE — ED Notes (Signed)
Pt made aware of the need for urine.

## 2016-02-24 NOTE — ED Notes (Signed)
PA at bedside.

## 2016-02-24 NOTE — ED Notes (Signed)
Pharm Tech looked up drug pt had filled today.  Drug was tizanidine 4 mg.

## 2016-02-24 NOTE — ED Notes (Signed)
Bed: [id number] Expected date:  Expected time:  Means of arrival:  Comments: Unresponsive on scene

## 2016-02-24 NOTE — ED Notes (Signed)
MD at bedside. 

## 2016-02-24 NOTE — Discharge Instructions (Signed)
DO NOT TAKE ANY MORE TIZANIDINE  Drug Toxicity Drug toxicity refers to harmful and unwanted (adverse) effects of a drug in your body. Drug toxicity often results from taking too much of a drug (overdose) by accident or on purpose. With some drugs, there is only a small difference between the dose that is needed to treat your condition and a dose that is harmful (narrow therapeutic range). However, any drug can be toxic at high doses, and even normal doses of certain drugs can be toxic for some people. These include over-the-counter (OTC) medicines. Drug toxicity can happen suddenly when you first start taking a drug or when you suddenly take too much of a drug (acute toxicity). It can also happen as a result of taking a drug for a long period of time (chronic toxicity). The effects of drug toxicity can be mild, dangerous, or even deadly. CAUSES Many things can cause drug toxicity. Common causes of acute toxicity include a drug overdose or an allergic reaction to a drug. Most drugs are broken down (metabolized) by your liver and eliminated (excreted) by your kidneys. Chronic drug toxicity can result from changes in the way that your body metabolizes a drug. This can happen, for example, if you weigh less than you did when you started taking a drug but you keep taking the same dose that you took at the heavier weight. RISK FACTORS You may have a higher risk for drug toxicity if you:  Are under 70 years of age or over 39 years of age.  Have liver disease, kidney disease, or another medical condition.  Are taking more than one drug.  Are pregnant.  Are allergic to certain drugs.  Have genes that cause you to be more affected by (susceptible to) certain drugs.  Take a drug that has a narrow therapeutic range. Certain types of drugs are more likely than others to cause toxicity. Many drugs have a narrow therapeutic range, including:  Blood thinners.  Heart medicines.  Diabetes  medicines.  Medicines to prevent or stop seizures.  Theophylline for asthma.  Lithium for bipolar disorder. SYMPTOMS Signs and symptoms of drug toxicity depend on the drug and the amount that was taken. They may start suddenly or develop gradually over time. DIAGNOSIS Drug toxicity may be diagnosed based on your symptoms. Some drugs have known side effects that suggest toxicity. It is important that you tell health care provider about all of the drugs that you are taking and whether you have ever had a reaction to a drug. Your health care provider will do a physical exam. You may have tests to check for drug toxicity, including:  Blood tests to measure the amount of the drug in your blood or to check for signs of kidney or liver damage.  Urine tests.  Other tests to check for organ damage. TREATMENT Treatment may include:  Stopping the drug.  Lowering the dose of the drug.  Switching to a different drug. You may also need treatment to stop or reverse the effects of the toxicity. These treatments depend on the drug that caused the toxicity, how severe the toxicity is, and which parts of your body are affected. HOME CARE INSTRUCTIONS  Take medicines only as directed by your health care provider. Always ask your health care provider to discuss the possible side effects of any new drug that you start taking.  Keep a list of all of the drugs that you take, including over-the-counter medicines. Bring this list with you to  all of your medical visits.  Read the drug inserts that come with your medicines.  Keep all follow-up visits as directed by your health care provider. This is important. SEEK MEDICAL CARE IF:  Your symptoms return.  You develop any new signs or symptoms when you are taking medicines.  You notice any signs that indicate that you are taking too much of your medicine, based on what your health care provider told you to watch for. SEEK IMMEDIATE MEDICAL CARE  IF:  You have chest pain.  You have difficulty breathing.  You have a loss of consciousness.   This information is not intended to replace advice given to you by your health care provider. Make sure you discuss any questions you have with your health care provider.   Document Released: 09/06/2005 Document Revised: 01/21/2015 Document Reviewed: 09/11/2014 Elsevier Interactive Patient Education [employer].

## 2016-03-17 ENCOUNTER — Ambulatory Visit
Admission: RE | Admit: 2016-03-17 | Discharge: 2016-03-17 | Disposition: A | Discharge status: Home / Self Care | Payer: Medicare Other | Source: Ambulatory Visit | Attending: Internal Medicine | Admitting: Internal Medicine

## 2016-03-17 ENCOUNTER — Other Ambulatory Visit: Payer: Self-pay | Admitting: Internal Medicine

## 2016-03-17 DIAGNOSIS — R05 Cough: Secondary | ICD-10-CM

## 2016-03-17 DIAGNOSIS — R059 Cough, unspecified: Secondary | ICD-10-CM

## 2016-04-05 ENCOUNTER — Other Ambulatory Visit: Payer: Self-pay | Admitting: Internal Medicine

## 2016-04-05 DIAGNOSIS — R222 Localized swelling, mass and lump, trunk: Secondary | ICD-10-CM

## 2016-04-27 ENCOUNTER — Ambulatory Visit
Admission: RE | Admit: 2016-04-27 | Discharge: 2016-04-27 | Disposition: A | Discharge status: Home / Self Care | Payer: Medicare Other | Source: Ambulatory Visit | Attending: Internal Medicine | Admitting: Internal Medicine

## 2016-04-27 DIAGNOSIS — R222 Localized swelling, mass and lump, trunk: Secondary | ICD-10-CM

## 2016-06-22 ENCOUNTER — Other Ambulatory Visit (HOSPITAL_BASED_OUTPATIENT_CLINIC_OR_DEPARTMENT_OTHER): Payer: Medicare Other

## 2016-06-22 DIAGNOSIS — D472 Monoclonal gammopathy: Secondary | ICD-10-CM

## 2016-06-22 LAB — COMPREHENSIVE METABOLIC PANEL
ALBUMIN: 3.4 g/dL — AB (ref 3.5–5.0)
ALK PHOS: 71 U/L (ref 40–150)
ALT: 13 U/L (ref 0–55)
ANION GAP: 7 meq/L (ref 3–11)
AST: 22 U/L (ref 5–34)
BILIRUBIN TOTAL: 0.35 mg/dL (ref 0.20–1.20)
BUN: 22.7 mg/dL (ref 7.0–26.0)
CALCIUM: 9.4 mg/dL (ref 8.4–10.4)
CO2: 28 mEq/L (ref 22–29)
Chloride: 103 mEq/L (ref 98–109)
Creatinine: 0.8 mg/dL (ref 0.6–1.1)
EGFR: 63 mL/min/{1.73_m2} — AB (ref >90)
Glucose: 95 mg/dl (ref 70–140)
Potassium: 4.7 mEq/L (ref 3.5–5.1)
Sodium: 137 mEq/L (ref 136–145)
TOTAL PROTEIN: 7.5 g/dL (ref 6.4–8.3)

## 2016-06-22 LAB — CBC WITH DIFFERENTIAL/PLATELET
BASO%: 0.1 % (ref 0.0–2.0)
Basophils Absolute: 0 10*3/uL (ref 0.0–0.1)
EOS ABS: 0.2 10*3/uL (ref 0.0–0.5)
EOS%: 2.2 % (ref 0.0–7.0)
HEMATOCRIT: 35.5 % (ref 34.8–46.6)
HEMOGLOBIN: 11.6 g/dL (ref 11.6–15.9)
LYMPH%: 20.4 % (ref 14.0–49.7)
MCH: 30.8 pg (ref 25.1–34.0)
MCHC: 32.7 g/dL (ref 31.5–36.0)
MCV: 94.2 fL (ref 79.5–101.0)
MONO#: 0.7 10*3/uL (ref 0.1–0.9)
MONO%: 9.9 % (ref 0.0–14.0)
NEUT%: 67.4 % (ref 38.4–76.8)
NEUTROS ABS: 4.8 10*3/uL (ref 1.5–6.5)
PLATELETS: 200 10*3/uL (ref 145–400)
RBC: 3.77 10*6/uL (ref 3.70–5.45)
RDW: 14.8 % — ABNORMAL HIGH (ref 11.2–14.5)
WBC: 7.2 10*3/uL (ref 3.9–10.3)
lymph#: 1.5 10*3/uL (ref 0.9–3.3)

## 2016-06-23 LAB — KAPPA/LAMBDA LIGHT CHAINS
Ig Kappa Free Light Chain: 31.3 mg/L — ABNORMAL HIGH (ref 3.3–19.4)
Ig Lambda Free Light Chain: 199.7 mg/L — ABNORMAL HIGH (ref 5.7–26.3)
Kappa/Lambda FluidC Ratio: 0.16 — ABNORMAL LOW (ref 0.26–1.65)

## 2016-06-24 LAB — MULTIPLE MYELOMA PANEL, SERUM
ALBUMIN SERPL ELPH-MCNC: 3.7 g/dL (ref 2.9–4.4)
ALPHA 1: 0.2 g/dL (ref 0.0–0.4)
Albumin/Glob SerPl: 1.2 (ref 0.7–1.7)
Alpha2 Glob SerPl Elph-Mcnc: 0.8 g/dL (ref 0.4–1.0)
B-Globulin SerPl Elph-Mcnc: 0.8 g/dL (ref 0.7–1.3)
Gamma Glob SerPl Elph-Mcnc: 1.3 g/dL (ref 0.4–1.8)
Globulin, Total: 3.2 g/dL (ref 2.2–3.9)
IGA/IMMUNOGLOBULIN A, SERUM: 107 mg/dL (ref 64–422)
IGM (IMMUNOGLOBIN M), SRM: 51 mg/dL (ref 26–217)
IgG, Qn, Serum: 1506 mg/dL (ref 700–1600)
M Protein SerPl Elph-Mcnc: 1 g/dL — ABNORMAL HIGH
Total Protein: 6.9 g/dL (ref 6.0–8.5)

## 2016-06-29 ENCOUNTER — Encounter: Payer: Self-pay | Admitting: Hematology and Oncology

## 2016-06-29 ENCOUNTER — Telehealth: Payer: Self-pay | Admitting: Hematology and Oncology

## 2016-06-29 ENCOUNTER — Ambulatory Visit (HOSPITAL_BASED_OUTPATIENT_CLINIC_OR_DEPARTMENT_OTHER): Payer: Medicare Other | Admitting: Hematology and Oncology

## 2016-06-29 DIAGNOSIS — D472 Monoclonal gammopathy: Secondary | ICD-10-CM

## 2016-06-29 DIAGNOSIS — I10 Essential (primary) hypertension: Secondary | ICD-10-CM

## 2016-06-29 DIAGNOSIS — Z7189 Other specified counseling: Secondary | ICD-10-CM | POA: Insufficient documentation

## 2016-06-29 NOTE — Assessment & Plan Note (Signed)
I reviewed her advanced directives. The patient confirmed that her CODE STATUS is DO NOT RESUSCITATE

## 2016-06-29 NOTE — Telephone Encounter (Signed)
Avs report and appointment schedule given to patient, per 06/29/16 los. °

## 2016-06-29 NOTE — Assessment & Plan Note (Signed)
Clinically, she has no signs of progression. Labs are stable with no disease progression Next year, I plan to see her back with history, physical examination and blood work a week of of time. 

## 2016-06-29 NOTE — Progress Notes (Signed)
[facility] OFFICE PROGRESS NOTE  Patient Care Team: Lorene Dy, MD as PCP - General (Internal Medicine)  SUMMARY OF ONCOLOGIC HISTORY:  This patient was diagnosed of MGUS detected on routine blood work in 2015. The patient recently had severe dehydration requiring hospitalization. She was also mildly anemic. She had history of severe back pain resolved recently. 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 She was being observed.  INTERVAL HISTORY: Please see below for problem oriented charting. She feels well. Denies new areas of bone pain. She has mild arthritis. She is due for influenza vaccination but prefers to wait until she sees her primary care doctor next week. She denies recent infection  REVIEW OF SYSTEMS:   Constitutional: Denies fevers, chills or abnormal weight loss Eyes: Denies blurriness of vision Ears, nose, mouth, throat, and face: Denies mucositis or sore throat Respiratory: Denies cough, dyspnea or wheezes Cardiovascular: Denies palpitation, chest discomfort or lower extremity swelling Gastrointestinal:  Denies nausea, heartburn or change in bowel habits Skin: Denies abnormal skin rashes Lymphatics: Denies new lymphadenopathy or easy bruising Neurological:Denies numbness, tingling or new weaknesses Behavioral/Psych: Mood is stable, no new changes  All other systems were reviewed with the patient and are negative.  I have reviewed the past medical history, past surgical history, social history and family history with the patient and they are unchanged from previous note.  ALLERGIES:  is allergic to zanaflex [tizanidine].  MEDICATIONS:  Current Outpatient Prescriptions  Medication Sig Dispense Refill  . acetaminophen (TYLENOL) 500 MG tablet Take 500 mg by mouth every 8 (eight) hours as needed for mild pain.    Marland Kitchen b complex vitamins tablet Take 1 tablet by mouth daily. Super B complex Nature Made    .  Calcium-Magnesium-Vitamin D (CALCIUM 500 PO) Take 500 mg by mouth daily.    . carboxymethylcellulose (REFRESH TEARS) 0.5 % SOLN Place 1 drop into both eyes at bedtime.    Marland Kitchen levothyroxine (SYNTHROID, LEVOTHROID) 125 MCG tablet Take 125 mcg by mouth daily.    . metoprolol (LOPRESSOR) 50 MG tablet Take 50 mg by mouth 2 (two) times daily.  4  . Multiple Vitamins-Calcium (ONE-A-DAY WOMENS PO) Take 1 tablet by mouth daily.    . Multiple Vitamins-Minerals (ICAPS AREDS 2 PO) Take 1 tablet by mouth 2 (two) times daily.    . polyethylene glycol (MIRALAX / GLYCOLAX) packet Take 17 g by mouth daily as needed for mild constipation.    . simvastatin (ZOCOR) 20 MG tablet Take 20 mg by mouth daily.     No current facility-administered medications for this visit.     PHYSICAL EXAMINATION: ECOG PERFORMANCE STATUS: 1 - Symptomatic but completely ambulatory  Vitals:   06/29/16 1056  BP: (!) 143/82  Pulse: 77  Resp: 17  Temp: 98.8 F (37.1 C)   Filed Weights   06/29/16 1056  Weight: 103 lb 1.6 oz (46.8 kg)    GENERAL:alert, no distress and comfortable. She looks very thin SKIN: skin color, texture, turgor are normal, no rashes or significant lesions EYES: normal, Conjunctiva are pink and non-injected, sclera clear Musculoskeletal:no cyanosis of digits and no clubbing  NEURO: alert & oriented x 3 with fluent speech, no focal motor/sensory deficits  LABORATORY DATA:  I have reviewed the data as listed    Component Value Date/Time   NA 137 06/22/2016 1007   K 4.7 06/22/2016 1007   CL 102 02/24/2016 2032   CO2 28 06/22/2016 1007   GLUCOSE 95 06/22/2016  1007   BUN 22.7 06/22/2016 1007   CREATININE 0.8 06/22/2016 1007   CALCIUM 9.4 06/22/2016 1007   PROT 6.9 06/22/2016 1007   PROT 7.5 06/22/2016 1007   ALBUMIN 3.4 (L) 06/22/2016 1007   AST 22 06/22/2016 1007   ALT 13 06/22/2016 1007   ALKPHOS 71 06/22/2016 1007   BILITOT 0.35 06/22/2016 1007   GFRNONAA 72 (L) 08/23/2013 0359   GFRAA 83 (L)  08/23/2013 0359    No results found for: SPEP, UPEP  Lab Results  Component Value Date   WBC 7.2 06/22/2016   NEUTROABS 4.8 06/22/2016   HGB 11.6 06/22/2016   HCT 35.5 06/22/2016   MCV 94.2 06/22/2016   PLT 200 06/22/2016      Chemistry      Component Value Date/Time   NA 137 06/22/2016 1007   K 4.7 06/22/2016 1007   CL 102 02/24/2016 2032   CO2 28 06/22/2016 1007   BUN 22.7 06/22/2016 1007   CREATININE 0.8 06/22/2016 1007      Component Value Date/Time   CALCIUM 9.4 06/22/2016 1007   ALKPHOS 71 06/22/2016 1007   AST 22 06/22/2016 1007   ALT 13 06/22/2016 1007   BILITOT 0.35 06/22/2016 1007     ASSESSMENT & PLAN:  MGUS (monoclonal gammopathy of unknown significance) Clinically, she has no signs of progression. Labs are stable with no disease progression Next year, I plan to see her back with history, physical examination and blood work a week of of time.  Essential hypertension Her blood pressure is better controlled this time. she will continue current medical management. I recommend close follow-up with primary care doctor for medication adjustment.   DNR (do not resuscitate) discussion I reviewed her advanced directives. The patient confirmed that her CODE STATUS is DO NOT RESUSCITATE   Orders Placed This Encounter  Procedures  . CBC with Differential/Platelet    Standing Status:   Future    Standing Expiration Date:   08/03/2017  . Comprehensive metabolic panel    Standing Status:   Future    Standing Expiration Date:   08/03/2017  . Kappa/lambda light chains    Standing Status:   Future    Standing Expiration Date:   08/03/2017  . Multiple Myeloma Panel (SPEP&IFE w/QIG)    Standing Status:   Future    Standing Expiration Date:   08/03/2017   All questions were answered. The patient knows to call the clinic with any problems, questions or concerns. No barriers to learning was detected. I spent 15 minutes counseling the patient face to face. The  total time spent in the appointment was 20 minutes and more than 50% was on counseling and review of test results     Heath Lark, MD 06/29/2016 11:23 AM

## 2016-06-29 NOTE — Assessment & Plan Note (Signed)
Her blood pressure is better controlled this time. she will continue current medical management. I recommend close follow-up with primary care doctor for medication adjustment.

## 2017-06-21 ENCOUNTER — Other Ambulatory Visit (HOSPITAL_BASED_OUTPATIENT_CLINIC_OR_DEPARTMENT_OTHER): Payer: Medicare Other

## 2017-06-21 DIAGNOSIS — D472 Monoclonal gammopathy: Secondary | ICD-10-CM

## 2017-06-21 LAB — CBC WITH DIFFERENTIAL/PLATELET
BASO%: 0.2 % (ref 0.0–2.0)
Basophils Absolute: 0 10*3/uL (ref 0.0–0.1)
EOS ABS: 0.1 10*3/uL (ref 0.0–0.5)
EOS%: 2.3 % (ref 0.0–7.0)
HCT: 36.8 % (ref 34.8–46.6)
HGB: 11.9 g/dL (ref 11.6–15.9)
LYMPH#: 1.9 10*3/uL (ref 0.9–3.3)
LYMPH%: 32.1 % (ref 14.0–49.7)
MCH: 31.5 pg (ref 25.1–34.0)
MCHC: 32.3 g/dL (ref 31.5–36.0)
MCV: 97.4 fL (ref 79.5–101.0)
MONO#: 0.5 10*3/uL (ref 0.1–0.9)
MONO%: 8.8 % (ref 0.0–14.0)
NEUT%: 56.6 % (ref 38.4–76.8)
NEUTROS ABS: 3.3 10*3/uL (ref 1.5–6.5)
PLATELETS: 207 10*3/uL (ref 145–400)
RBC: 3.78 10*6/uL (ref 3.70–5.45)
RDW: 14.4 % (ref 11.2–14.5)
WBC: 5.8 10*3/uL (ref 3.9–10.3)

## 2017-06-21 LAB — COMPREHENSIVE METABOLIC PANEL
ALBUMIN: 3.9 g/dL (ref 3.5–5.0)
ALT: 15 U/L (ref 0–55)
ANION GAP: 8 meq/L (ref 3–11)
AST: 27 U/L (ref 5–34)
Alkaline Phosphatase: 60 U/L (ref 40–150)
BILIRUBIN TOTAL: 0.45 mg/dL (ref 0.20–1.20)
BUN: 23.5 mg/dL (ref 7.0–26.0)
CALCIUM: 10 mg/dL (ref 8.4–10.4)
CO2: 28 meq/L (ref 22–29)
CREATININE: 0.9 mg/dL (ref 0.6–1.1)
Chloride: 101 mEq/L (ref 98–109)
EGFR: 57 mL/min/{1.73_m2} — AB (ref >90)
Glucose: 98 mg/dl (ref 70–140)
Potassium: 4.4 mEq/L (ref 3.5–5.1)
Sodium: 138 mEq/L (ref 136–145)
TOTAL PROTEIN: 7.9 g/dL (ref 6.4–8.3)

## 2017-06-22 LAB — KAPPA/LAMBDA LIGHT CHAINS
IG KAPPA FREE LIGHT CHAIN: 29 mg/L — AB (ref 3.3–19.4)
Ig Lambda Free Light Chain: 193.6 mg/L — ABNORMAL HIGH (ref 5.7–26.3)
Kappa/Lambda FluidC Ratio: 0.15 — ABNORMAL LOW (ref 0.26–1.65)

## 2017-06-23 LAB — MULTIPLE MYELOMA PANEL, SERUM
ALBUMIN/GLOB SERPL: 1.1 (ref 0.7–1.7)
ALPHA 1: 0.3 g/dL (ref 0.0–0.4)
Albumin SerPl Elph-Mcnc: 3.7 g/dL (ref 2.9–4.4)
Alpha2 Glob SerPl Elph-Mcnc: 0.9 g/dL (ref 0.4–1.0)
B-Globulin SerPl Elph-Mcnc: 0.9 g/dL (ref 0.7–1.3)
GAMMA GLOB SERPL ELPH-MCNC: 1.7 g/dL (ref 0.4–1.8)
GLOBULIN, TOTAL: 3.7 g/dL (ref 2.2–3.9)
IGA/IMMUNOGLOBULIN A, SERUM: 129 mg/dL (ref 64–422)
IGM (IMMUNOGLOBIN M), SRM: 52 mg/dL (ref 26–217)
IgG, Qn, Serum: 1515 mg/dL (ref 700–1600)
M Protein SerPl Elph-Mcnc: 1.4 g/dL — ABNORMAL HIGH
Total Protein: 7.4 g/dL (ref 6.0–8.5)

## 2017-06-28 ENCOUNTER — Telehealth: Payer: Self-pay | Admitting: Hematology and Oncology

## 2017-06-28 ENCOUNTER — Ambulatory Visit (HOSPITAL_BASED_OUTPATIENT_CLINIC_OR_DEPARTMENT_OTHER): Payer: Medicare Other | Admitting: Hematology and Oncology

## 2017-06-28 ENCOUNTER — Encounter: Payer: Self-pay | Admitting: Hematology and Oncology

## 2017-06-28 DIAGNOSIS — D472 Monoclonal gammopathy: Secondary | ICD-10-CM

## 2017-06-28 DIAGNOSIS — I10 Essential (primary) hypertension: Secondary | ICD-10-CM

## 2017-06-28 NOTE — Progress Notes (Signed)
[place] OFFICE PROGRESS NOTE  Patient Care Team: Lorene Dy, MD as PCP - General (Internal Medicine)  SUMMARY OF ONCOLOGIC HISTORY:  This patient was diagnosed of MGUS detected on routine blood work in 2015. The patient recently had severe dehydration requiring hospitalization. She was also mildly anemic. She had history of severe back pain resolved recently. 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 She was being observed.  INTERVAL HISTORY: Please see below for problem oriented charting. She returns for further follow-up She denies recent infection No back pain She stated she is going to see her primary care doctor soon for influenza vaccination  REVIEW OF SYSTEMS:   Constitutional: Denies fevers, chills or abnormal weight loss Eyes: Denies blurriness of vision Ears, nose, mouth, throat, and face: Denies mucositis or sore throat Respiratory: Denies cough, dyspnea or wheezes Cardiovascular: Denies palpitation, chest discomfort or lower extremity swelling Gastrointestinal:  Denies nausea, heartburn or change in bowel habits Skin: Denies abnormal skin rashes Lymphatics: Denies new lymphadenopathy or easy bruising Neurological:Denies numbness, tingling or new weaknesses Behavioral/Psych: Mood is stable, no new changes  All other systems were reviewed with the patient and are negative.  I have reviewed the past medical history, past surgical history, social history and family history with the patient and they are unchanged from previous note.  ALLERGIES:  is allergic to zanaflex [tizanidine].  MEDICATIONS:  Current Outpatient Prescriptions  Medication Sig Dispense Refill  . acetaminophen (TYLENOL) 500 MG tablet Take 500 mg by mouth every 8 (eight) hours as needed for mild pain.    Marland Kitchen b complex vitamins tablet Take 1 tablet by mouth daily. Super B complex Nature Made    . Calcium-Magnesium-Vitamin D (CALCIUM 500 PO) Take 500 mg by  mouth daily.    . carboxymethylcellulose (REFRESH TEARS) 0.5 % SOLN Place 1 drop into both eyes at bedtime.    Marland Kitchen levothyroxine (SYNTHROID, LEVOTHROID) 125 MCG tablet Take 125 mcg by mouth daily.    . metoprolol (LOPRESSOR) 50 MG tablet Take 50 mg by mouth 2 (two) times daily.  4  . Multiple Vitamins-Calcium (ONE-A-DAY WOMENS PO) Take 1 tablet by mouth daily.    . Multiple Vitamins-Minerals (ICAPS AREDS 2 PO) Take 1 tablet by mouth 2 (two) times daily.    . polyethylene glycol (MIRALAX / GLYCOLAX) packet Take 17 g by mouth daily as needed for mild constipation.    . simvastatin (ZOCOR) 20 MG tablet Take 20 mg by mouth daily.     No current facility-administered medications for this visit.     PHYSICAL EXAMINATION: ECOG PERFORMANCE STATUS: 1 - Symptomatic but completely ambulatory  Vitals:   06/28/17 1101  BP: (!) 169/73  Pulse: 77  Resp: 20  Temp: 98.3 F (36.8 C)  SpO2: 98%   Filed Weights   06/28/17 1101  Weight: 98 lb 9.6 oz (44.7 kg)    GENERAL:alert, no distress and comfortable.  She looks thin and cachectic SKIN: skin color, texture, turgor are normal, no rashes or significant lesions EYES: normal, Conjunctiva are pink and non-injected, sclera clear Musculoskeletal:no cyanosis of digits and no clubbing  NEURO: alert & oriented x 3 with fluent speech, no focal motor/sensory deficits  LABORATORY DATA:  I have reviewed the data as listed    Component Value Date/Time   NA 138 06/21/2017 1056   K 4.4 06/21/2017 1056   CL 102 02/24/2016 2032   CO2 28 06/21/2017 1056   GLUCOSE 98 06/21/2017 1056   BUN  23.5 06/21/2017 1056   CREATININE 0.9 06/21/2017 1056   CALCIUM 10.0 06/21/2017 1056   PROT 7.4 06/21/2017 1056   PROT 7.9 06/21/2017 1056   ALBUMIN 3.9 06/21/2017 1056   AST 27 06/21/2017 1056   ALT 15 06/21/2017 1056   ALKPHOS 60 06/21/2017 1056   BILITOT 0.45 06/21/2017 1056   GFRNONAA 72 (L) 08/23/2013 0359   GFRAA 83 (L) 08/23/2013 0359    No results found  for: SPEP, UPEP  Lab Results  Component Value Date   WBC 5.8 06/21/2017   NEUTROABS 3.3 06/21/2017   HGB 11.9 06/21/2017   HCT 36.8 06/21/2017   MCV 97.4 06/21/2017   PLT 207 06/21/2017      Chemistry      Component Value Date/Time   NA 138 06/21/2017 1056   K 4.4 06/21/2017 1056   CL 102 02/24/2016 2032   CO2 28 06/21/2017 1056   BUN 23.5 06/21/2017 1056   CREATININE 0.9 06/21/2017 1056      Component Value Date/Time   CALCIUM 10.0 06/21/2017 1056   ALKPHOS 60 06/21/2017 1056   AST 27 06/21/2017 1056   ALT 15 06/21/2017 1056   BILITOT 0.45 06/21/2017 1056       ASSESSMENT & PLAN:  MGUS (monoclonal gammopathy of unknown significance) Clinically, she has no signs of progression. Labs are stable with no disease progression Next year, I plan to see her back with history, physical examination and blood work a week of of time.  Essential hypertension Her blood pressure is mildly elevated she will continue current medical management. I recommend close follow-up with primary care doctor for medication adjustment.    No orders of the defined types were placed in this encounter.  All questions were answered. The patient knows to call the clinic with any problems, questions or concerns. No barriers to learning was detected. I spent 10 minutes counseling the patient face to face. The total time spent in the appointment was 15 minutes and more than 50% was on counseling and review of test results     Heath Lark, MD 06/28/2017 11:46 AM

## 2017-06-28 NOTE — Telephone Encounter (Signed)
Gave avs and calendar for October 2019 °

## 2017-06-28 NOTE — Assessment & Plan Note (Signed)
Clinically, she has no signs of progression. Labs are stable with no disease progression Next year, I plan to see her back with history, physical examination and blood work a week of of time. 

## 2017-06-28 NOTE — Assessment & Plan Note (Signed)
Her blood pressure is mildly elevated she will continue current medical management. I recommend close follow-up with primary care doctor for medication adjustment.

## 2018-06-19 ENCOUNTER — Other Ambulatory Visit: Payer: Self-pay | Admitting: Hematology and Oncology

## 2018-06-19 DIAGNOSIS — D472 Monoclonal gammopathy: Secondary | ICD-10-CM

## 2018-06-20 ENCOUNTER — Inpatient Hospital Stay: Payer: Medicare Other | Attending: Hematology and Oncology

## 2018-06-20 DIAGNOSIS — I10 Essential (primary) hypertension: Secondary | ICD-10-CM | POA: Diagnosis not present

## 2018-06-20 DIAGNOSIS — D472 Monoclonal gammopathy: Secondary | ICD-10-CM | POA: Insufficient documentation

## 2018-06-20 DIAGNOSIS — Z79899 Other long term (current) drug therapy: Secondary | ICD-10-CM | POA: Insufficient documentation

## 2018-06-20 DIAGNOSIS — H3509 Other intraretinal microvascular abnormalities: Secondary | ICD-10-CM | POA: Insufficient documentation

## 2018-06-20 LAB — CBC WITH DIFFERENTIAL/PLATELET
Basophils Absolute: 0 10*3/uL (ref 0.0–0.1)
Basophils Relative: 0 %
Eosinophils Absolute: 0.2 10*3/uL (ref 0.0–0.5)
Eosinophils Relative: 4 %
HCT: 34.2 % — ABNORMAL LOW (ref 34.8–46.6)
HEMOGLOBIN: 11.5 g/dL — AB (ref 11.6–15.9)
LYMPHS ABS: 1.7 10*3/uL (ref 0.9–3.3)
Lymphocytes Relative: 25 %
MCH: 31 pg (ref 25.1–34.0)
MCHC: 33.5 g/dL (ref 31.5–36.0)
MCV: 92.5 fL (ref 79.5–101.0)
Monocytes Absolute: 0.8 10*3/uL (ref 0.1–0.9)
Monocytes Relative: 11 %
NEUTROS ABS: 4.1 10*3/uL (ref 1.5–6.5)
NEUTROS PCT: 60 %
Platelets: 208 10*3/uL (ref 145–400)
RBC: 3.7 MIL/uL (ref 3.70–5.45)
RDW: 13.7 % (ref 11.2–14.5)
WBC: 6.8 10*3/uL (ref 3.9–10.3)

## 2018-06-20 LAB — COMPREHENSIVE METABOLIC PANEL
ALK PHOS: 61 U/L (ref 38–126)
ALT: 13 U/L (ref 0–44)
AST: 25 U/L (ref 15–41)
Albumin: 3.6 g/dL (ref 3.5–5.0)
Anion gap: 6 (ref 5–15)
BUN: 26 mg/dL — ABNORMAL HIGH (ref 8–23)
CALCIUM: 9.9 mg/dL (ref 8.9–10.3)
CO2: 32 mmol/L (ref 22–32)
CREATININE: 0.86 mg/dL (ref 0.44–1.00)
Chloride: 100 mmol/L (ref 98–111)
GFR, EST NON AFRICAN AMERICAN: 55 mL/min — AB (ref >60)
Glucose, Bld: 82 mg/dL (ref 70–99)
Potassium: 4.3 mmol/L (ref 3.5–5.1)
SODIUM: 138 mmol/L (ref 135–145)
Total Bilirubin: 0.3 mg/dL (ref 0.3–1.2)
Total Protein: 7.7 g/dL (ref 6.5–8.1)

## 2018-06-21 LAB — MULTIPLE MYELOMA PANEL, SERUM
ALBUMIN SERPL ELPH-MCNC: 3.6 g/dL (ref 2.9–4.4)
ALPHA 1: 0.3 g/dL (ref 0.0–0.4)
Albumin/Glob SerPl: 1.1 (ref 0.7–1.7)
Alpha2 Glob SerPl Elph-Mcnc: 0.9 g/dL (ref 0.4–1.0)
B-Globulin SerPl Elph-Mcnc: 0.8 g/dL (ref 0.7–1.3)
GAMMA GLOB SERPL ELPH-MCNC: 1.4 g/dL (ref 0.4–1.8)
GLOBULIN, TOTAL: 3.4 g/dL (ref 2.2–3.9)
IGA: 94 mg/dL (ref 64–422)
IgG (Immunoglobin G), Serum: 1555 mg/dL (ref 700–1600)
IgM (Immunoglobulin M), Srm: 42 mg/dL (ref 26–217)
M PROTEIN SERPL ELPH-MCNC: 1.1 g/dL — AB
TOTAL PROTEIN ELP: 7 g/dL (ref 6.0–8.5)

## 2018-06-21 LAB — KAPPA/LAMBDA LIGHT CHAINS
Kappa free light chain: 36.7 mg/L — ABNORMAL HIGH (ref 3.3–19.4)
Kappa, lambda light chain ratio: 0.17 — ABNORMAL LOW (ref 0.26–1.65)
Lambda free light chains: 215.4 mg/L — ABNORMAL HIGH (ref 5.7–26.3)

## 2018-06-21 LAB — BETA 2 MICROGLOBULIN, SERUM: BETA 2 MICROGLOBULIN: 3.2 mg/L — AB (ref 0.6–2.4)

## 2018-06-27 ENCOUNTER — Encounter: Payer: Self-pay | Admitting: Hematology and Oncology

## 2018-06-27 ENCOUNTER — Telehealth: Payer: Self-pay | Admitting: Hematology and Oncology

## 2018-06-27 ENCOUNTER — Inpatient Hospital Stay (HOSPITAL_BASED_OUTPATIENT_CLINIC_OR_DEPARTMENT_OTHER): Payer: Medicare Other | Admitting: Hematology and Oncology

## 2018-06-27 DIAGNOSIS — Z79899 Other long term (current) drug therapy: Secondary | ICD-10-CM | POA: Diagnosis not present

## 2018-06-27 DIAGNOSIS — H3509 Other intraretinal microvascular abnormalities: Secondary | ICD-10-CM

## 2018-06-27 DIAGNOSIS — I10 Essential (primary) hypertension: Secondary | ICD-10-CM | POA: Diagnosis not present

## 2018-06-27 DIAGNOSIS — D472 Monoclonal gammopathy: Secondary | ICD-10-CM | POA: Diagnosis not present

## 2018-06-27 NOTE — Telephone Encounter (Signed)
Gave patient avs and calendar.   °

## 2018-06-27 NOTE — Assessment & Plan Note (Signed)
Clinically, she has no signs of progression. Labs are stable with no disease progression Next year, I plan to see her back with history, physical examination and blood work a week of of time.

## 2018-06-27 NOTE — Progress Notes (Signed)
[place] OFFICE PROGRESS NOTE  Patient Care Team: Lorene Dy, MD as PCP - General (Internal Medicine)  ASSESSMENT & PLAN:  MGUS (monoclonal gammopathy of unknown significance) Clinically, she has no signs of progression. Labs are stable with no disease progression Next year, I plan to see her back with history, physical examination and blood work a week of of time.  Abnormal blood vessels in left eye She has abnormal redness affecting her left eye without visual difficulties I recommend observation only.  If she has visual changes, she is instructed to see her eye doctor for further evaluation  Essential hypertension Her blood pressure is mildly elevated she will continue current medical management. I recommend close follow-up with primary care doctor for medication adjustment.    No orders of the defined types were placed in this encounter.   INTERVAL HISTORY: Please see below for problem oriented charting. She returns for further follow-up with caregivers She denies recent bone pain She is vaccinated for influenza vaccination She fell several months ago and had been treated with steroid injection She complained of abnormal appearance of her left eye without visual changes  SUMMARY OF ONCOLOGIC HISTORY:  No history exists.    REVIEW OF SYSTEMS:   Constitutional: Denies fevers, chills or abnormal weight loss Eyes: Denies blurriness of vision Ears, nose, mouth, throat, and face: Denies mucositis or sore throat Respiratory: Denies cough, dyspnea or wheezes Cardiovascular: Denies palpitation, chest discomfort or lower extremity swelling Gastrointestinal:  Denies nausea, heartburn or change in bowel habits Skin: Denies abnormal skin rashes Lymphatics: Denies new lymphadenopathy or easy bruising Neurological:Denies numbness, tingling or new weaknesses Behavioral/Psych: Mood is stable, no new changes  All other systems were reviewed with the patient and  are negative.  I have reviewed the past medical history, past surgical history, social history and family history with the patient and they are unchanged from previous note.  ALLERGIES:  is allergic to zanaflex [tizanidine].  MEDICATIONS:  Current Outpatient Medications  Medication Sig Dispense Refill  . b complex vitamins tablet Take 1 tablet by mouth daily. Super B complex Nature Made    . Calcium-Magnesium-Vitamin D (CALCIUM 500 PO) Take 500 mg by mouth daily.    . carboxymethylcellulose (REFRESH TEARS) 0.5 % SOLN Place 1 drop into both eyes at bedtime.    Marland Kitchen levothyroxine (SYNTHROID, LEVOTHROID) 125 MCG tablet Take 125 mcg by mouth daily.    . metoprolol (LOPRESSOR) 50 MG tablet Take 50 mg by mouth 2 (two) times daily.  4  . Multiple Vitamins-Calcium (ONE-A-DAY WOMENS PO) Take 1 tablet by mouth daily.    . Multiple Vitamins-Minerals (ICAPS AREDS 2 PO) Take 1 tablet by mouth 2 (two) times daily.    . polyethylene glycol (MIRALAX / GLYCOLAX) packet Take 17 g by mouth daily as needed for mild constipation.    . simvastatin (ZOCOR) 20 MG tablet Take 20 mg by mouth daily.     No current facility-administered medications for this visit.     PHYSICAL EXAMINATION: ECOG PERFORMANCE STATUS: 1 - Symptomatic but completely ambulatory  Vitals:   06/27/18 1051  BP: (!) 157/69  Pulse: 70  Resp: 18  Temp: 97.7 F (36.5 C)  SpO2: 100%   Filed Weights   06/27/18 1051  Weight: 97 lb 3.2 oz (44.1 kg)    GENERAL:alert, no distress and comfortable.  She looks thin and cachectic EYES:  mild conjunctival redness affecting her left eye NEURO: alert & oriented x 3 with fluent speech, no  focal motor/sensory deficits  LABORATORY DATA:  I have reviewed the data as listed    Component Value Date/Time   NA 138 06/20/2018 1111   NA 138 06/21/2017 1056   K 4.3 06/20/2018 1111   K 4.4 06/21/2017 1056   CL 100 06/20/2018 1111   CO2 32 06/20/2018 1111   CO2 28 06/21/2017 1056   GLUCOSE 82  06/20/2018 1111   GLUCOSE 98 06/21/2017 1056   BUN 26 (H) 06/20/2018 1111   BUN 23.5 06/21/2017 1056   CREATININE 0.86 06/20/2018 1111   CREATININE 0.9 06/21/2017 1056   CALCIUM 9.9 06/20/2018 1111   CALCIUM 10.0 06/21/2017 1056   PROT 7.7 06/20/2018 1111   PROT 7.4 06/21/2017 1056   PROT 7.9 06/21/2017 1056   ALBUMIN 3.6 06/20/2018 1111   ALBUMIN 3.9 06/21/2017 1056   AST 25 06/20/2018 1111   AST 27 06/21/2017 1056   ALT 13 06/20/2018 1111   ALT 15 06/21/2017 1056   ALKPHOS 61 06/20/2018 1111   ALKPHOS 60 06/21/2017 1056   BILITOT 0.3 06/20/2018 1111   BILITOT 0.45 06/21/2017 1056   GFRNONAA 55 (L) 06/20/2018 1111   GFRAA >60 06/20/2018 1111    No results found for: SPEP, UPEP  Lab Results  Component Value Date   WBC 6.8 06/20/2018   NEUTROABS 4.1 06/20/2018   HGB 11.5 (L) 06/20/2018   HCT 34.2 (L) 06/20/2018   MCV 92.5 06/20/2018   PLT 208 06/20/2018      Chemistry      Component Value Date/Time   NA 138 06/20/2018 1111   NA 138 06/21/2017 1056   K 4.3 06/20/2018 1111   K 4.4 06/21/2017 1056   CL 100 06/20/2018 1111   CO2 32 06/20/2018 1111   CO2 28 06/21/2017 1056   BUN 26 (H) 06/20/2018 1111   BUN 23.5 06/21/2017 1056   CREATININE 0.86 06/20/2018 1111   CREATININE 0.9 06/21/2017 1056      Component Value Date/Time   CALCIUM 9.9 06/20/2018 1111   CALCIUM 10.0 06/21/2017 1056   ALKPHOS 61 06/20/2018 1111   ALKPHOS 60 06/21/2017 1056   AST 25 06/20/2018 1111   AST 27 06/21/2017 1056   ALT 13 06/20/2018 1111   ALT 15 06/21/2017 1056   BILITOT 0.3 06/20/2018 1111   BILITOT 0.45 06/21/2017 1056       All questions were answered. The patient knows to call the clinic with any problems, questions or concerns. No barriers to learning was detected.  I spent 10 minutes counseling the patient face to face. The total time spent in the appointment was 15 minutes and more than 50% was on counseling and review of test results  Heath Lark, MD 06/27/2018 2:27  PM

## 2018-06-27 NOTE — Assessment & Plan Note (Signed)
She has abnormal redness affecting her left eye without visual difficulties I recommend observation only.  If she has visual changes, she is instructed to see her eye doctor for further evaluation

## 2018-06-27 NOTE — Assessment & Plan Note (Signed)
Her blood pressure is mildly elevated she will continue current medical management. I recommend close follow-up with primary care doctor for medication adjustment.

## 2018-08-01 ENCOUNTER — Other Ambulatory Visit: Payer: Self-pay | Admitting: Internal Medicine

## 2018-08-01 DIAGNOSIS — S0990XA Unspecified injury of head, initial encounter: Secondary | ICD-10-CM

## 2018-08-03 ENCOUNTER — Ambulatory Visit
Admission: RE | Admit: 2018-08-03 | Discharge: 2018-08-03 | Disposition: A | Discharge status: Home / Self Care | Payer: Medicare Other | Source: Ambulatory Visit | Attending: Internal Medicine | Admitting: Internal Medicine

## 2018-08-03 DIAGNOSIS — S0990XA Unspecified injury of head, initial encounter: Secondary | ICD-10-CM

## 2019-03-28 ENCOUNTER — Other Ambulatory Visit: Payer: Self-pay

## 2019-03-28 ENCOUNTER — Ambulatory Visit
Admission: RE | Admit: 2019-03-28 | Discharge: 2019-03-28 | Disposition: A | Discharge status: Home / Self Care | Payer: Medicare Other | Source: Ambulatory Visit | Attending: Internal Medicine | Admitting: Internal Medicine

## 2019-03-28 ENCOUNTER — Other Ambulatory Visit: Payer: Self-pay | Admitting: Internal Medicine

## 2019-03-28 DIAGNOSIS — R52 Pain, unspecified: Secondary | ICD-10-CM

## 2019-06-25 ENCOUNTER — Other Ambulatory Visit: Payer: Self-pay | Admitting: Hematology and Oncology

## 2019-06-25 DIAGNOSIS — D472 Monoclonal gammopathy: Secondary | ICD-10-CM

## 2019-06-26 ENCOUNTER — Other Ambulatory Visit: Payer: Self-pay

## 2019-06-26 ENCOUNTER — Inpatient Hospital Stay: Payer: Medicare Other | Attending: Hematology and Oncology

## 2019-06-26 DIAGNOSIS — D472 Monoclonal gammopathy: Secondary | ICD-10-CM

## 2019-06-26 DIAGNOSIS — D631 Anemia in chronic kidney disease: Secondary | ICD-10-CM | POA: Insufficient documentation

## 2019-06-26 DIAGNOSIS — Z23 Encounter for immunization: Secondary | ICD-10-CM | POA: Insufficient documentation

## 2019-06-26 DIAGNOSIS — Z79899 Other long term (current) drug therapy: Secondary | ICD-10-CM | POA: Diagnosis not present

## 2019-06-26 DIAGNOSIS — N189 Chronic kidney disease, unspecified: Secondary | ICD-10-CM | POA: Diagnosis not present

## 2019-06-26 DIAGNOSIS — I129 Hypertensive chronic kidney disease with stage 1 through stage 4 chronic kidney disease, or unspecified chronic kidney disease: Secondary | ICD-10-CM | POA: Diagnosis not present

## 2019-06-26 LAB — COMPREHENSIVE METABOLIC PANEL
ALT: 11 U/L (ref 0–44)
AST: 23 U/L (ref 15–41)
Albumin: 3.6 g/dL (ref 3.5–5.0)
Alkaline Phosphatase: 65 U/L (ref 38–126)
Anion gap: 8 (ref 5–15)
BUN: 26 mg/dL — ABNORMAL HIGH (ref 8–23)
CO2: 27 mmol/L (ref 22–32)
Calcium: 9 mg/dL (ref 8.9–10.3)
Chloride: 103 mmol/L (ref 98–111)
Creatinine, Ser: 0.81 mg/dL (ref 0.44–1.00)
GFR calc Af Amer: >60 mL/min (ref >60)
GFR calc non Af Amer: >60 mL/min (ref >60)
Glucose, Bld: 81 mg/dL (ref 70–99)
Potassium: 4.4 mmol/L (ref 3.5–5.1)
Sodium: 138 mmol/L (ref 135–145)
Total Bilirubin: 0.4 mg/dL (ref 0.3–1.2)
Total Protein: 7.3 g/dL (ref 6.5–8.1)

## 2019-06-26 LAB — CBC WITH DIFFERENTIAL/PLATELET
Abs Immature Granulocytes: 0.03 10*3/uL (ref 0.00–0.07)
Basophils Absolute: 0 10*3/uL (ref 0.0–0.1)
Basophils Relative: 0 %
Eosinophils Absolute: 0.1 10*3/uL (ref 0.0–0.5)
Eosinophils Relative: 1 %
HCT: 34.1 % — ABNORMAL LOW (ref 36.0–46.0)
Hemoglobin: 10.8 g/dL — ABNORMAL LOW (ref 12.0–15.0)
Immature Granulocytes: 0 %
Lymphocytes Relative: 23 %
Lymphs Abs: 2.3 10*3/uL (ref 0.7–4.0)
MCH: 30.2 pg (ref 26.0–34.0)
MCHC: 31.7 g/dL (ref 30.0–36.0)
MCV: 95.3 fL (ref 80.0–100.0)
Monocytes Absolute: 0.8 10*3/uL (ref 0.1–1.0)
Monocytes Relative: 9 %
Neutro Abs: 6.5 10*3/uL (ref 1.7–7.7)
Neutrophils Relative %: 67 %
Platelets: 204 10*3/uL (ref 150–400)
RBC: 3.58 MIL/uL — ABNORMAL LOW (ref 3.87–5.11)
RDW: 14.2 % (ref 11.5–15.5)
WBC: 9.7 10*3/uL (ref 4.0–10.5)
nRBC: 0 % (ref 0.0–0.2)

## 2019-06-27 LAB — KAPPA/LAMBDA LIGHT CHAINS
Kappa free light chain: 31.6 mg/L — ABNORMAL HIGH (ref 3.3–19.4)
Kappa, lambda light chain ratio: 0.15 — ABNORMAL LOW (ref 0.26–1.65)
Lambda free light chains: 216 mg/L — ABNORMAL HIGH (ref 5.7–26.3)

## 2019-06-28 LAB — MULTIPLE MYELOMA PANEL, SERUM
Albumin SerPl Elph-Mcnc: 3.4 g/dL (ref 2.9–4.4)
Albumin/Glob SerPl: 1.1 (ref 0.7–1.7)
Alpha 1: 0.3 g/dL (ref 0.0–0.4)
Alpha2 Glob SerPl Elph-Mcnc: 0.9 g/dL (ref 0.4–1.0)
B-Globulin SerPl Elph-Mcnc: 0.7 g/dL (ref 0.7–1.3)
Gamma Glob SerPl Elph-Mcnc: 1.3 g/dL (ref 0.4–1.8)
Globulin, Total: 3.2 g/dL (ref 2.2–3.9)
IgA: 104 mg/dL (ref 64–422)
IgG (Immunoglobin G), Serum: 1553 mg/dL (ref 586–1602)
IgM (Immunoglobulin M), Srm: 42 mg/dL (ref 26–217)
M Protein SerPl Elph-Mcnc: 1.1 g/dL — ABNORMAL HIGH
Total Protein ELP: 6.6 g/dL (ref 6.0–8.5)

## 2019-07-03 ENCOUNTER — Encounter: Payer: Self-pay | Admitting: Hematology and Oncology

## 2019-07-03 ENCOUNTER — Other Ambulatory Visit: Payer: Self-pay

## 2019-07-03 ENCOUNTER — Telehealth: Payer: Self-pay | Admitting: Hematology and Oncology

## 2019-07-03 ENCOUNTER — Inpatient Hospital Stay (HOSPITAL_BASED_OUTPATIENT_CLINIC_OR_DEPARTMENT_OTHER): Payer: Medicare Other | Admitting: Hematology and Oncology

## 2019-07-03 VITALS — BP 150/66 | HR 91 | Temp 98.3°F | Resp 17 | Ht 66.0 in | Wt 99.6 lb

## 2019-07-03 DIAGNOSIS — N189 Chronic kidney disease, unspecified: Secondary | ICD-10-CM | POA: Diagnosis not present

## 2019-07-03 DIAGNOSIS — D472 Monoclonal gammopathy: Secondary | ICD-10-CM | POA: Diagnosis not present

## 2019-07-03 DIAGNOSIS — D631 Anemia in chronic kidney disease: Secondary | ICD-10-CM

## 2019-07-03 DIAGNOSIS — Z23 Encounter for immunization: Secondary | ICD-10-CM

## 2019-07-03 DIAGNOSIS — I10 Essential (primary) hypertension: Secondary | ICD-10-CM | POA: Diagnosis not present

## 2019-07-03 MED ORDER — INFLUENZA VAC A&B SA ADJ QUAD 0.5 ML IM PRSY
PREFILLED_SYRINGE | INTRAMUSCULAR | Status: AC
  Filled 2019-07-03: qty 0.5

## 2019-07-03 MED ORDER — INFLUENZA VAC A&B SA ADJ QUAD 0.5 ML IM PRSY
0.5000 mL | PREFILLED_SYRINGE | Freq: Once | INTRAMUSCULAR | Status: AC
  Administered 2019-07-03: 11:00:00 0.5 mL via INTRAMUSCULAR

## 2019-07-03 NOTE — Assessment & Plan Note (Addendum)
Clinically, she has no signs of progression. Labs are stable with no disease progression Next year, I plan to see her back with history, physical examination and blood work a week of of time.  We discussed the importance of preventive care and reviewed the vaccination programs. She does not have any prior allergic reactions to influenza vaccination. She agrees to proceed with influenza vaccination today and we will administer it today at the clinic.

## 2019-07-03 NOTE — Assessment & Plan Note (Signed)
Her blood pressure is mildly elevated she will continue current medical management. I recommend close follow-up with primary care doctor for medication adjustment.

## 2019-07-03 NOTE — Telephone Encounter (Signed)
I talk with patient regarding schedule  

## 2019-07-03 NOTE — Assessment & Plan Note (Signed)
This is likely anemia of chronic disease. The patient denies recent history of bleeding such as epistaxis, hematuria or hematochezia. She is asymptomatic from the anemia. We will observe for now.  She does not require transfusion now. I do not recommend any further work-up at this time.   

## 2019-07-03 NOTE — Progress Notes (Signed)
[facility] OFFICE PROGRESS NOTE  Patient Care Team: Lorene Dy, MD as PCP - General (Internal Medicine)  ASSESSMENT & PLAN:  MGUS (monoclonal gammopathy of unknown significance) Clinically, she has no signs of progression. Labs are stable with no disease progression Next year, I plan to see her back with history, physical examination and blood work a week of of time.  We discussed the importance of preventive care and reviewed the vaccination programs. She does not have any prior allergic reactions to influenza vaccination. She agrees to proceed with influenza vaccination today and we will administer it today at the clinic.   Essential hypertension Her blood pressure is mildly elevated she will continue current medical management. I recommend close follow-up with primary care doctor for medication adjustment.   Anemia in chronic kidney disease This is likely anemia of chronic disease. The patient denies recent history of bleeding such as epistaxis, hematuria or hematochezia. She is asymptomatic from the anemia. We will observe for now.  She does not require transfusion now. I do not recommend any further work-up at this time.     No orders of the defined types were placed in this encounter.   INTERVAL HISTORY: Please see below for problem oriented charting. She returns for further follow-up She feels well No recent infection, fever or chills Denies headache from high blood pressure No new bone pain  SUMMARY OF ONCOLOGIC HISTORY: Oncology History   No history exists.    REVIEW OF SYSTEMS:   Constitutional: Denies fevers, chills or abnormal weight loss Eyes: Denies blurriness of vision Ears, nose, mouth, throat, and face: Denies mucositis or sore throat Respiratory: Denies cough, dyspnea or wheezes Cardiovascular: Denies palpitation, chest discomfort or lower extremity swelling Gastrointestinal:  Denies nausea, heartburn or change in bowel  habits Skin: Denies abnormal skin rashes Lymphatics: Denies new lymphadenopathy or easy bruising Neurological:Denies numbness, tingling or new weaknesses Behavioral/Psych: Mood is stable, no new changes  All other systems were reviewed with the patient and are negative.  I have reviewed the past medical history, past surgical history, social history and family history with the patient and they are unchanged from previous note.  ALLERGIES:  is allergic to zanaflex [tizanidine].  MEDICATIONS:  Current Outpatient Medications  Medication Sig Dispense Refill  . b complex vitamins tablet Take 1 tablet by mouth daily. Super B complex Nature Made    . Calcium-Magnesium-Vitamin D (CALCIUM 500 PO) Take 500 mg by mouth daily.    . carboxymethylcellulose (REFRESH TEARS) 0.5 % SOLN Place 1 drop into both eyes at bedtime.    Marland Kitchen levothyroxine (SYNTHROID, LEVOTHROID) 125 MCG tablet Take 125 mcg by mouth daily.    . metoprolol (LOPRESSOR) 50 MG tablet Take 50 mg by mouth 2 (two) times daily.  4  . Multiple Vitamins-Calcium (ONE-A-DAY WOMENS PO) Take 1 tablet by mouth daily.    . Multiple Vitamins-Minerals (ICAPS AREDS 2 PO) Take 1 tablet by mouth 2 (two) times daily.    . polyethylene glycol (MIRALAX / GLYCOLAX) packet Take 17 g by mouth daily as needed for mild constipation.    . simvastatin (ZOCOR) 20 MG tablet Take 20 mg by mouth daily.     No current facility-administered medications for this visit.     PHYSICAL EXAMINATION: ECOG PERFORMANCE STATUS: 1 - Symptomatic but completely ambulatory  Vitals:   07/03/19 1112  BP: (!) 150/66  Pulse: 91  Resp: 17  Temp: 98.3 F (36.8 C)  SpO2: 99%   Filed Weights  07/03/19 1112  Weight: 99 lb 9.6 oz (45.2 kg)    GENERAL:alert, no distress and comfortable.  She looks thin and frail SKIN: skin color, texture, turgor are normal, no rashes or significant lesions EYES: normal, Conjunctiva are pink and non-injected, sclera clear OROPHARYNX:no  exudate, no erythema and lips, buccal mucosa, and tongue normal  NECK: supple, thyroid normal size, non-tender, without nodularity LYMPH:  no palpable lymphadenopathy in the cervical, axillary or inguinal LUNGS: clear to auscultation and percussion with normal breathing effort HEART: regular rate & rhythm and no murmurs and no lower extremity edema ABDOMEN:abdomen soft, non-tender and normal bowel sounds Musculoskeletal:no cyanosis of digits and no clubbing  NEURO: alert & oriented x 3 with fluent speech, no focal motor/sensory deficits  LABORATORY DATA:  I have reviewed the data as listed    Component Value Date/Time   NA 138 06/26/2019 1144   NA 138 06/21/2017 1056   K 4.4 06/26/2019 1144   K 4.4 06/21/2017 1056   CL 103 06/26/2019 1144   CO2 27 06/26/2019 1144   CO2 28 06/21/2017 1056   GLUCOSE 81 06/26/2019 1144   GLUCOSE 98 06/21/2017 1056   BUN 26 (H) 06/26/2019 1144   BUN 23.5 06/21/2017 1056   CREATININE 0.81 06/26/2019 1144   CREATININE 0.9 06/21/2017 1056   CALCIUM 9.0 06/26/2019 1144   CALCIUM 10.0 06/21/2017 1056   PROT 7.3 06/26/2019 1144   PROT 7.4 06/21/2017 1056   PROT 7.9 06/21/2017 1056   ALBUMIN 3.6 06/26/2019 1144   ALBUMIN 3.9 06/21/2017 1056   AST 23 06/26/2019 1144   AST 27 06/21/2017 1056   ALT 11 06/26/2019 1144   ALT 15 06/21/2017 1056   ALKPHOS 65 06/26/2019 1144   ALKPHOS 60 06/21/2017 1056   BILITOT 0.4 06/26/2019 1144   BILITOT 0.45 06/21/2017 1056   GFRNONAA >60 06/26/2019 1144   GFRAA >60 06/26/2019 1144    No results found for: SPEP, UPEP  Lab Results  Component Value Date   WBC 9.7 06/26/2019   NEUTROABS 6.5 06/26/2019   HGB 10.8 (L) 06/26/2019   HCT 34.1 (L) 06/26/2019   MCV 95.3 06/26/2019   PLT 204 06/26/2019      Chemistry      Component Value Date/Time   NA 138 06/26/2019 1144   NA 138 06/21/2017 1056   K 4.4 06/26/2019 1144   K 4.4 06/21/2017 1056   CL 103 06/26/2019 1144   CO2 27 06/26/2019 1144   CO2 28  06/21/2017 1056   BUN 26 (H) 06/26/2019 1144   BUN 23.5 06/21/2017 1056   CREATININE 0.81 06/26/2019 1144   CREATININE 0.9 06/21/2017 1056      Component Value Date/Time   CALCIUM 9.0 06/26/2019 1144   CALCIUM 10.0 06/21/2017 1056   ALKPHOS 65 06/26/2019 1144   ALKPHOS 60 06/21/2017 1056   AST 23 06/26/2019 1144   AST 27 06/21/2017 1056   ALT 11 06/26/2019 1144   ALT 15 06/21/2017 1056   BILITOT 0.4 06/26/2019 1144   BILITOT 0.45 06/21/2017 1056      All questions were answered. The patient knows to call the clinic with any problems, questions or concerns. No barriers to learning was detected.  I spent 15 minutes counseling the patient face to face. The total time spent in the appointment was 20 minutes and more than 50% was on counseling and review of test results  Heath Lark, MD 07/03/2019 11:55 AM

## 2020-02-21 ENCOUNTER — Ambulatory Visit: Payer: Medicare Other | Admitting: Podiatry

## 2020-02-21 ENCOUNTER — Other Ambulatory Visit: Payer: Self-pay | Admitting: Internal Medicine

## 2020-02-21 DIAGNOSIS — R42 Dizziness and giddiness: Secondary | ICD-10-CM

## 2020-02-21 DIAGNOSIS — R519 Headache, unspecified: Secondary | ICD-10-CM

## 2020-03-03 ENCOUNTER — Ambulatory Visit (INDEPENDENT_AMBULATORY_CARE_PROVIDER_SITE_OTHER): Payer: Medicare Other | Admitting: Podiatrist

## 2020-03-03 ENCOUNTER — Encounter: Payer: Self-pay | Admitting: Podiatrist

## 2020-03-03 ENCOUNTER — Telehealth: Payer: Self-pay | Admitting: *Deleted

## 2020-03-03 ENCOUNTER — Other Ambulatory Visit: Payer: Self-pay

## 2020-03-03 VITALS — Temp 96.4°F

## 2020-03-03 DIAGNOSIS — B351 Tinea unguium: Secondary | ICD-10-CM

## 2020-03-03 DIAGNOSIS — M79674 Pain in right toe(s): Secondary | ICD-10-CM

## 2020-03-03 DIAGNOSIS — M79675 Pain in left toe(s): Secondary | ICD-10-CM | POA: Diagnosis not present

## 2020-03-03 NOTE — Telephone Encounter (Signed)
Pt's dtr, Di Kindle states they forgot the dtr's house number for release of records it is [id number] and the phone [phone].

## 2020-03-04 NOTE — Progress Notes (Signed)
  Chief Complaint  Patient presents with  . Nail Problem    Very thick, yellow, long toenails - bilateral, 1-5.  . Diabetes    Pt stated, "Medicare says that I'm diabetic. PCP says it's borderline". No history of ulcers per pt. Pt could not answer questions about neuropathy. Could not recall most recent A1c.     HPI: Patient is 86 y.o. female who presents with her daughter for the concerns as listed above.  She states her primary care physician was trimming her nails in the past.  She presents today with long toenails that she is unable to trim on her own.    Review of Systems No fevers, chills, nausea, muscle aches, no difficulty breathing, no calf pain, no chest pain or shortness of breath.   Physical Exam  GENERAL APPEARANCE: Alert, conversant. Appropriately groomed. No acute distress.   VASCULAR: Pedal pulses palpable DP and PT bilateral.  Capillary refill time is immediate to all digits,  Proximal to distal cooling it warm to warm.  Good digital perfusion noted.   NEUROLOGIC: sensation is intact epicritically and protectively to 5.07 monofilament at 5/5 sites bilateral.  Light touch is intact bilateral, vibratory sensation intact bilateral, achilles tendon reflex is intact bilateral.   MUSCULOSKELETAL: acceptable muscle strength, tone and stability bilateral.  No gross boney pedal deformities noted.  No pain, crepitus or limitation noted with foot and ankle range of motion bilateral.   DERMATOLOGIC: skin is warm, supple, and dry.  No open lesions noted.  No rash, no pre ulcerative lesions. Digital nails are significantly elongated, thick, discolored, dystrophic with subungual debris present.  They are also painful with pressure and with debridement.    Assessment     ICD-10-CM   1. Pain due to onychomycosis of toenails of both feet  B35.1    M79.675    M79.674      Plan  Debridement of toenails was recommended.  Onychoreduction of symptomatic toenails was performed via  nail nipper and power burr without iatrogenic incident.  Patient was instructed on signs and symptoms of infection and was told to call immediately should any of these arise.  She will return in 3 months or as requested for continued routine nail care.

## 2020-03-07 ENCOUNTER — Other Ambulatory Visit: Payer: Medicare Other

## 2020-03-07 ENCOUNTER — Ambulatory Visit
Admission: RE | Admit: 2020-03-07 | Discharge: 2020-03-07 | Disposition: A | Discharge status: Home / Self Care | Payer: Medicare Other | Source: Ambulatory Visit | Attending: Internal Medicine | Admitting: Internal Medicine

## 2020-03-07 DIAGNOSIS — R42 Dizziness and giddiness: Secondary | ICD-10-CM

## 2020-03-07 DIAGNOSIS — R519 Headache, unspecified: Secondary | ICD-10-CM

## 2020-04-02 ENCOUNTER — Ambulatory Visit: Payer: Medicare Other | Admitting: Podiatry

## 2020-06-03 ENCOUNTER — Ambulatory Visit: Payer: Medicare Other | Admitting: Podiatry

## 2020-06-24 ENCOUNTER — Telehealth: Payer: Self-pay

## 2020-06-24 NOTE — Telephone Encounter (Signed)
Pt's daughter called to cancel appt stating she can not make it in on 10/7. Per Dr Alvy Bimler, it is OK for pt to f/u w/PCP at this time. Di Kindle verbalized agreement and understanding.

## 2020-06-26 ENCOUNTER — Inpatient Hospital Stay: Payer: Medicare Other

## 2020-07-03 ENCOUNTER — Inpatient Hospital Stay: Payer: Medicare Other | Admitting: Hematology and Oncology

## 2020-07-21 DEATH — deceased
